# Patient Record
Sex: Female | Born: 1970 | Race: White | Hispanic: No | Marital: Married | Smoking: Never smoker
Health system: Southern US, Community
[De-identification: ages and names within clinical notes are randomized; demographics above are authoritative.]

## PROBLEM LIST (undated history)

## (undated) DIAGNOSIS — E669 Obesity, unspecified: Secondary | ICD-10-CM

## (undated) DIAGNOSIS — J310 Chronic rhinitis: Secondary | ICD-10-CM

## (undated) DIAGNOSIS — B369 Superficial mycosis, unspecified: Secondary | ICD-10-CM

## (undated) DIAGNOSIS — I10 Essential (primary) hypertension: Secondary | ICD-10-CM

## (undated) HISTORY — DX: Essential (primary) hypertension: I10

## (undated) HISTORY — PX: TONSILLECTOMY: SUR1361

## (undated) HISTORY — DX: Superficial mycosis, unspecified: B36.9

## (undated) HISTORY — DX: Obesity, unspecified: E66.9

## (undated) HISTORY — DX: Chronic rhinitis: J31.0

---
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
## 2005-01-05 ENCOUNTER — Encounter: Payer: Self-pay | Admitting: Family Medicine

## 2005-01-05 LAB — CONVERTED CEMR LAB

## 2006-01-09 ENCOUNTER — Ambulatory Visit: Payer: Self-pay | Admitting: Family Medicine

## 2006-01-29 ENCOUNTER — Encounter: Payer: Self-pay | Admitting: Family Medicine

## 2006-02-12 ENCOUNTER — Encounter: Payer: Self-pay | Admitting: Family Medicine

## 2006-02-12 LAB — CONVERTED CEMR LAB
BUN: 15 mg/dL
CO2: 26 meq/L
Chloride: 104 meq/L
Cholesterol: 200 mg/dL
Creatinine, Ser: 0.8 mg/dL
HDL: 55 mg/dL
Platelets: 307 10*3/uL
Potassium: 3.8 meq/L
WBC, blood: 6.2 10*3/uL

## 2006-03-01 ENCOUNTER — Ambulatory Visit: Payer: Self-pay | Admitting: Family Medicine

## 2006-04-12 ENCOUNTER — Ambulatory Visit: Payer: Self-pay | Admitting: Family Medicine

## 2006-05-15 DIAGNOSIS — E669 Obesity, unspecified: Secondary | ICD-10-CM | POA: Insufficient documentation

## 2006-07-05 ENCOUNTER — Encounter: Payer: Self-pay | Admitting: Family Medicine

## 2006-07-05 ENCOUNTER — Ambulatory Visit: Payer: Self-pay | Admitting: Family Medicine

## 2006-07-05 DIAGNOSIS — I10 Essential (primary) hypertension: Secondary | ICD-10-CM | POA: Insufficient documentation

## 2006-07-05 DIAGNOSIS — N920 Excessive and frequent menstruation with regular cycle: Secondary | ICD-10-CM

## 2006-07-20 ENCOUNTER — Ambulatory Visit: Payer: Self-pay | Admitting: Family Medicine

## 2006-08-14 ENCOUNTER — Encounter: Payer: Self-pay | Admitting: Family Medicine

## 2006-09-03 ENCOUNTER — Ambulatory Visit: Payer: Self-pay | Admitting: Family Medicine

## 2006-12-03 ENCOUNTER — Ambulatory Visit: Payer: Self-pay | Admitting: Family Medicine

## 2006-12-03 DIAGNOSIS — J310 Chronic rhinitis: Secondary | ICD-10-CM | POA: Insufficient documentation

## 2007-02-06 ENCOUNTER — Encounter: Payer: Self-pay | Admitting: Family Medicine

## 2007-02-06 LAB — CONVERTED CEMR LAB
ALT: 18 units/L (ref 0–35)
AST: 16 units/L (ref 0–37)
Albumin: 3.7 g/dL (ref 3.5–5.2)
BUN: 18 mg/dL (ref 6–23)
Calcium: 9.4 mg/dL (ref 8.4–10.5)
Chloride: 106 meq/L (ref 96–112)
Creatinine, Ser: 0.71 mg/dL (ref 0.40–1.20)
HDL: 72 mg/dL (ref >39)
Potassium: 4.9 meq/L (ref 3.5–5.3)
Total Bilirubin: 0.3 mg/dL (ref 0.3–1.2)
Total CHOL/HDL Ratio: 2.9
Total Protein: 6.8 g/dL (ref 6.0–8.3)
Triglycerides: 97 mg/dL (ref <150)
VLDL: 19 mg/dL (ref 0–40)

## 2007-02-11 ENCOUNTER — Encounter: Payer: Self-pay | Admitting: Family Medicine

## 2007-03-08 ENCOUNTER — Other Ambulatory Visit: Admission: RE | Admit: 2007-03-08 | Discharge: 2007-03-08 | Payer: Self-pay | Admitting: Family Medicine

## 2007-03-08 ENCOUNTER — Encounter: Payer: Self-pay | Admitting: Family Medicine

## 2007-03-08 ENCOUNTER — Ambulatory Visit: Payer: Self-pay | Admitting: Family Medicine

## 2007-03-13 ENCOUNTER — Ambulatory Visit: Payer: Self-pay | Admitting: Family Medicine

## 2007-03-13 DIAGNOSIS — B369 Superficial mycosis, unspecified: Secondary | ICD-10-CM | POA: Insufficient documentation

## 2007-03-14 ENCOUNTER — Telehealth (INDEPENDENT_AMBULATORY_CARE_PROVIDER_SITE_OTHER): Payer: Self-pay | Admitting: *Deleted

## 2008-03-23 ENCOUNTER — Encounter: Payer: Self-pay | Admitting: Family Medicine

## 2008-03-23 ENCOUNTER — Other Ambulatory Visit: Admission: RE | Admit: 2008-03-23 | Discharge: 2008-03-23 | Payer: Self-pay | Admitting: Family Medicine

## 2008-03-23 ENCOUNTER — Ambulatory Visit: Payer: Self-pay | Admitting: Family Medicine

## 2008-03-23 ENCOUNTER — Encounter: Admission: RE | Admit: 2008-03-23 | Discharge: 2008-03-23 | Payer: Self-pay | Admitting: Family Medicine

## 2008-03-25 ENCOUNTER — Encounter: Payer: Self-pay | Admitting: Family Medicine

## 2008-03-25 LAB — CONVERTED CEMR LAB
ALT: 30 units/L (ref 0–35)
CO2: 24 meq/L (ref 19–32)
Creatinine, Ser: 0.74 mg/dL (ref 0.40–1.20)
Glucose, Bld: 90 mg/dL (ref 70–99)
HDL: 69 mg/dL (ref >39)
LDL Goal: 160 mg/dL
Potassium: 4.2 meq/L (ref 3.5–5.3)
Sodium: 137 meq/L (ref 135–145)
Total Bilirubin: 0.5 mg/dL (ref 0.3–1.2)
Total Protein: 7.1 g/dL (ref 6.0–8.3)

## 2008-03-26 LAB — CONVERTED CEMR LAB: Pap Smear: NORMAL

## 2010-01-31 ENCOUNTER — Ambulatory Visit (HOSPITAL_COMMUNITY): Admission: RE | Admit: 2010-01-31 | Discharge: 2010-01-31 | Payer: Self-pay | Admitting: Family Medicine

## 2012-01-15 ENCOUNTER — Other Ambulatory Visit: Payer: Self-pay | Admitting: Gynecology

## 2013-01-01 ENCOUNTER — Other Ambulatory Visit: Payer: Self-pay | Admitting: Women's Health

## 2013-01-01 DIAGNOSIS — Z139 Encounter for screening, unspecified: Secondary | ICD-10-CM

## 2013-01-13 ENCOUNTER — Ambulatory Visit (HOSPITAL_COMMUNITY)
Admission: RE | Admit: 2013-01-13 | Discharge: 2013-01-13 | Disposition: A | Discharge status: Home / Self Care | Payer: BC Managed Care – PPO | Source: Ambulatory Visit | Attending: Women's Health | Admitting: Women's Health

## 2013-01-13 DIAGNOSIS — Z1231 Encounter for screening mammogram for malignant neoplasm of breast: Secondary | ICD-10-CM | POA: Insufficient documentation

## 2013-01-13 DIAGNOSIS — Z139 Encounter for screening, unspecified: Secondary | ICD-10-CM

## 2013-01-23 ENCOUNTER — Ambulatory Visit (INDEPENDENT_AMBULATORY_CARE_PROVIDER_SITE_OTHER): Payer: BC Managed Care – PPO | Admitting: Women's Health

## 2013-01-23 ENCOUNTER — Encounter: Payer: Self-pay | Admitting: Women's Health

## 2013-01-23 VITALS — BP 112/78 | Ht 64.0 in | Wt 208.5 lb

## 2013-01-23 DIAGNOSIS — Z13220 Encounter for screening for lipoid disorders: Secondary | ICD-10-CM

## 2013-01-23 DIAGNOSIS — E079 Disorder of thyroid, unspecified: Secondary | ICD-10-CM

## 2013-01-23 DIAGNOSIS — Z833 Family history of diabetes mellitus: Secondary | ICD-10-CM

## 2013-01-23 DIAGNOSIS — IMO0001 Reserved for inherently not codable concepts without codable children: Secondary | ICD-10-CM

## 2013-01-23 DIAGNOSIS — Z309 Encounter for contraceptive management, unspecified: Secondary | ICD-10-CM

## 2013-01-23 DIAGNOSIS — Z01419 Encounter for gynecological examination (general) (routine) without abnormal findings: Secondary | ICD-10-CM

## 2013-01-23 LAB — LIPID PANEL
HDL: 70 mg/dL (ref >39)
LDL Cholesterol: 101 mg/dL — ABNORMAL HIGH (ref 0–99)
Triglycerides: 152 mg/dL — ABNORMAL HIGH (ref <150)

## 2013-01-23 LAB — CBC WITH DIFFERENTIAL/PLATELET
Eosinophils Relative: 2 % (ref 0–5)
Hemoglobin: 14 g/dL (ref 12.0–15.0)
MCH: 25.5 pg — ABNORMAL LOW (ref 26.0–34.0)
MCHC: 32.6 g/dL (ref 30.0–36.0)
MCV: 78.3 fL (ref 78.0–100.0)
Neutro Abs: 4.1 10*3/uL (ref 1.7–7.7)
Platelets: 342 10*3/uL (ref 150–400)
RBC: 5.48 MIL/uL — ABNORMAL HIGH (ref 3.87–5.11)
WBC: 7.5 10*3/uL (ref 4.0–10.5)

## 2013-01-23 LAB — TSH: TSH: 2.922 u[IU]/mL (ref 0.350–4.500)

## 2013-01-23 LAB — GLUCOSE, RANDOM: Glucose, Bld: 83 mg/dL (ref 70–99)

## 2013-01-23 MED ORDER — ETONOGESTREL-ETHINYL ESTRADIOL 0.12-0.015 MG/24HR VA RING: VAGINAL_RING | VAGINAL | Status: DC

## 2013-01-23 NOTE — Patient Instructions (Signed)

## 2013-01-23 NOTE — Progress Notes (Signed)
Sara Harding January 12, 1971 [id number]    History:    New patient from Dr Nicholas Lose presents for annual exam. Monthly cycle on NuvaRing with good relief from menorrhagia. Not sexually active years. Normal Pap and mammogram history.   Past medical history, past surgical history, family history and social history were all reviewed and documented in the EPIC chart. 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 Father 15 and healthy.   ROS:  A  ROS was performed and pertinent positives and negatives are included in the history.  Exam:  Filed Vitals:   01/23/13 0932  BP: 112/78    General appearance:  Normal Head/Neck:  Normal, without cervical or supraclavicular adenopathy. Thyroid:  Symmetrical, normal in size, without palpable masses or nodularity. Respiratory  Effort:  Normal  Auscultation:  Clear without wheezing or rhonchi Cardiovascular  Auscultation:  Regular rate, without rubs, murmurs or gallops  Edema/varicosities:  Not grossly evident Abdominal  Soft,nontender, without masses, guarding or rebound.  Liver/spleen:  No organomegaly noted  Hernia:  None appreciated  Skin  Inspection:  Grossly normal  Palpation:  Grossly normal Neurologic/psychiatric  Orientation:  Normal with appropriate conversation.  Mood/affect:  Normal  Genitourinary    Breasts: Examined lying and sitting.     Right: Without masses, retractions, discharge or axillary adenopathy.     Left: Without masses, retractions, discharge or axillary adenopathy.   Inguinal/mons:  Normal without inguinal adenopathy  External genitalia:  Normal  BUS/Urethra/Skene's glands:  Normal  Bladder:  Normal  Vagina:  Normal  Cervix:  Normal  Uterus:   normal in size, shape and contour.  Midline and mobile  Adnexa/parametria:     Rt: Without masses or tenderness.   Lt: Without masses or tenderness.  Anus and perineum: Normal  Digital rectal exam: Normal sphincter tone without palpated masses or  tenderness  Assessment/Plan:  42 y.o. SWF G0  for annual exam with no complaints.  NuvaRing for cycle control/menorrhagia Obesity  Plan: Continue zumba classes, decrease calories for weight loss and health. SBE's, continue annual mammogram, calcium rich diet, vitamin D 1000 daily encouraged. CBC, glucose, lipid panel, TSH, UA, Pap normal 2013, new screening guidelines reviewed. Nuva ring prescription, proper use, risk for blood clots and strokes reviewed and accepted.    [name] [facility], 10:16 AM 01/23/2013

## 2013-01-24 LAB — URINALYSIS W MICROSCOPIC + REFLEX CULTURE
Bacteria, UA: NONE SEEN
Bilirubin Urine: NEGATIVE
Casts: NONE SEEN
Crystals: NONE SEEN
Glucose, UA: NEGATIVE mg/dL
Ketones, ur: NEGATIVE mg/dL
Leukocytes, UA: NEGATIVE
Nitrite: NEGATIVE
Protein, ur: NEGATIVE mg/dL
Specific Gravity, Urine: 1.019 (ref 1.005–1.030)
Squamous Epithelial / HPF: NONE SEEN
Urobilinogen, UA: 0.2 mg/dL (ref 0.0–1.0)
pH: 6.5 (ref 5.0–8.0)

## 2013-06-12 ENCOUNTER — Other Ambulatory Visit: Payer: Self-pay

## 2013-07-22 ENCOUNTER — Emergency Department (HOSPITAL_COMMUNITY)
Admission: EM | Admit: 2013-07-22 | Discharge: 2013-07-22 | Disposition: A | Discharge status: Home / Self Care | Payer: BC Managed Care – PPO | Attending: Emergency Medicine | Admitting: Emergency Medicine

## 2013-07-22 ENCOUNTER — Emergency Department (HOSPITAL_COMMUNITY): Payer: BC Managed Care – PPO

## 2013-07-22 ENCOUNTER — Encounter (HOSPITAL_COMMUNITY): Payer: Self-pay | Admitting: Emergency Medicine

## 2013-07-22 DIAGNOSIS — IMO0001 Reserved for inherently not codable concepts without codable children: Secondary | ICD-10-CM | POA: Insufficient documentation

## 2013-07-22 DIAGNOSIS — J159 Unspecified bacterial pneumonia: Secondary | ICD-10-CM | POA: Insufficient documentation

## 2013-07-22 DIAGNOSIS — J189 Pneumonia, unspecified organism: Secondary | ICD-10-CM

## 2013-07-22 DIAGNOSIS — R000 Tachycardia, unspecified: Secondary | ICD-10-CM | POA: Insufficient documentation

## 2013-07-22 DIAGNOSIS — Z79899 Other long term (current) drug therapy: Secondary | ICD-10-CM | POA: Insufficient documentation

## 2013-07-22 MED ORDER — ALBUTEROL SULFATE HFA 108 (90 BASE) MCG/ACT IN AERS
2.0000 | INHALATION_SPRAY | RESPIRATORY_TRACT | Status: DC
  Administered 2013-07-22: 2 via RESPIRATORY_TRACT
  Filled 2013-07-22: qty 6.7

## 2013-07-22 MED ORDER — HYDROCODONE-ACETAMINOPHEN 5-325 MG PO TABS: ORAL_TABLET | ORAL | Status: DC

## 2013-07-22 MED ORDER — CEFTRIAXONE SODIUM 1 G IJ SOLR
1.0000 g | Freq: Once | INTRAMUSCULAR | Status: AC
  Administered 2013-07-22: 1 g via INTRAMUSCULAR
  Filled 2013-07-22: qty 10

## 2013-07-22 MED ORDER — ONDANSETRON 4 MG PO TBDP
4.0000 mg | ORAL_TABLET | Freq: Once | ORAL | Status: AC
  Administered 2013-07-22: 4 mg via ORAL
  Filled 2013-07-22: qty 1

## 2013-07-22 MED ORDER — ACETAMINOPHEN 325 MG PO TABS
650.0000 mg | ORAL_TABLET | Freq: Once | ORAL | Status: AC
  Administered 2013-07-22: 650 mg via ORAL
  Filled 2013-07-22: qty 2

## 2013-07-22 MED ORDER — AZITHROMYCIN 250 MG PO TABS
500.0000 mg | ORAL_TABLET | Freq: Once | ORAL | Status: AC
  Administered 2013-07-22: 500 mg via ORAL
  Filled 2013-07-22: qty 2

## 2013-07-22 MED ORDER — HYDROCODONE-ACETAMINOPHEN 5-325 MG PO TABS
2.0000 | ORAL_TABLET | Freq: Once | ORAL | Status: AC
  Administered 2013-07-22: 2 via ORAL
  Filled 2013-07-22: qty 2

## 2013-07-22 MED ORDER — LIDOCAINE HCL (PF) 1 % IJ SOLN
INTRAMUSCULAR | Status: AC
  Administered 2013-07-22: 2.1 mL
  Filled 2013-07-22: qty 5

## 2013-07-22 MED ORDER — AZITHROMYCIN 250 MG PO TABS: ORAL_TABLET | ORAL | Status: DC

## 2013-07-22 NOTE — ED Notes (Signed)
Pt c/o right rib pain and sinus congestion.

## 2013-07-22 NOTE — ED Notes (Signed)
Pain rt side of chest, says she has been breathing shallow due to  Pain.  Sinus congestion.  Alert, NAD

## 2013-07-22 NOTE — ED Provider Notes (Signed)
CSN: [id number]     Arrival date & time 07/22/13  1804 History   First MD Initiated Contact with Patient 07/22/13 1929     Chief Complaint  Patient presents with  . Rib pain    (Consider location/radiation/quality/duration/timing/severity/associated sxs/prior Treatment) Patient is a 42 y.o. female presenting with chest pain. The history is provided by the patient.  Chest Pain Pain location:  R chest Pain quality: aching   Pain radiates to:  Does not radiate Pain radiates to the back: no   Pain severity:  Moderate Onset quality:  Gradual Timing:  Intermittent Progression:  Worsening Context: breathing and movement   Relieved by:  Nothing Exacerbated by: cough and certain movement. Associated symptoms: shortness of breath   Associated symptoms: no abdominal pain, no back pain, no cough, no dizziness and no palpitations   Risk factors: no hypertension, no immobilization, no smoking and no surgery     History reviewed. No pertinent past medical history. Past Surgical History  Procedure Laterality Date  . Tonsillectomy     Family History  Problem Relation Age of Onset  . Diabetes Mother   . Hypertension Mother    History  Substance Use Topics  . Smoking status: Never Smoker   . Smokeless tobacco: Never Used  . Alcohol Use: Yes     Comment: socially   OB History   Grav Para Term Preterm Abortions TAB SAB Ect Mult Living   0              Review of Systems  Constitutional: Negative for activity change.       All ROS Neg except as noted in HPI  HENT: Negative for nosebleeds.   Eyes: Negative for photophobia and discharge.  Respiratory: Positive for shortness of breath. Negative for cough and wheezing.   Cardiovascular: Positive for chest pain. Negative for palpitations.  Gastrointestinal: Negative for abdominal pain and blood in stool.  Genitourinary: Negative for dysuria, frequency and hematuria.  Musculoskeletal: Positive for myalgias. Negative for arthralgias, back  pain and neck pain.  Skin: Negative.   Neurological: Negative for dizziness, seizures and speech difficulty.  Psychiatric/Behavioral: Negative for hallucinations and confusion.    Allergies  Diphenhydramine hcl  Home Medications   Current Outpatient Rx  Name  Route  Sig  Dispense  Refill  . Cholecalciferol (VITAMIN D) 400 UNITS capsule   Oral   Take 400 Units by mouth daily.         Marland Kitchen etonogestrel-ethinyl estradiol (NUVARING) 0.12-0.015 MG/24HR vaginal ring      Insert vaginally and leave in place for 3 consecutive weeks, then remove for 1 week.   1 each   12   . Multiple Vitamin (MULTIVITAMIN) tablet   Oral   Take 1 tablet by mouth daily.          BP 150/76  Pulse 120  Temp(Src) 102.1 F (38.9 C) (Oral)  Resp 18  SpO2 100%  LMP 07/08/2013 Physical Exam  Nursing note and vitals reviewed. Constitutional: She is oriented to person, place, and time. She appears well-developed and well-nourished.  Non-toxic appearance.  HENT:  Head: Normocephalic.  Right Ear: Tympanic membrane and external ear normal.  Left Ear: Tympanic membrane and external ear normal.  Eyes: EOM and lids are normal. Pupils are equal, round, and reactive to light.  Neck: Normal range of motion. Neck supple. Carotid bruit is not present.  Cardiovascular: Regular rhythm, normal heart sounds, intact distal pulses and normal pulses.  Tachycardia present.  Pulmonary/Chest: Breath sounds normal. No respiratory distress. She exhibits tenderness.  Right greater than left chest wall tenderness and rhonchi. No rub. Course breath sounds on the right.  Abdominal: Soft. Bowel sounds are normal. There is no tenderness. There is no guarding.  Musculoskeletal: Normal range of motion.  Lymphadenopathy:       Head (right side): No submandibular adenopathy present.       Head (left side): No submandibular adenopathy present.    She has no cervical adenopathy.  Neurological: She is alert and oriented to person,  place, and time. She has normal strength. No cranial nerve deficit or sensory deficit.  Skin: Skin is warm and dry.  Psychiatric: She has a normal mood and affect. Her speech is normal.    ED Course  Procedures (including critical care time) Labs Review Labs Reviewed - No data to display Imaging Review Dg Chest 2 View  07/22/2013   CLINICAL DATA:  Cough and right-sided chest pain  EXAM: CHEST  2 VIEW  COMPARISON:  None.  FINDINGS: The heart and pulmonary vascularity are within normal limits. The left lung is clear. The right lung demonstrates diffuse upper lobe infiltrate consistent with pneumonia. No sizable effusion is seen. No bony abnormality is noted.  IMPRESSION: Right upper lobe pneumonia. Followup films following appropriate therapy are recommended.   Electronically Signed   By: Alcide Clever M.D.   On: 07/22/2013 18:41    EKG Interpretation   None       MDM  No diagnosis found. **I have reviewed nursing notes, vital signs, and all appropriate lab and imaging results for this patient.*  Pulse Ox 100% on room air. WNL by my interpretation. Chest x-ray is consistent with right upper lobe pneumonia. Plan: Patient treated in the emergency department with intramuscular Rocephin and oral Zithromax. Patient given a albuterol inhaler. Prescription for Zithromax and Norco given to the patient. Patient advised to be rechecked in one week. Patient invited to return to the emergency department if any changes, problems, or concerns.  Kathie Dike, PA-C 07/22/13 1952  Kathie Dike, PA-C 07/22/13 1954

## 2013-07-23 NOTE — ED Provider Notes (Signed)
  Medical screening examination/treatment/procedure(s) were performed by non-physician practitioner and as supervising physician I was immediately available for consultation/collaboration.  EKG Interpretation   None          Jazmine Heckman, MD 07/23/13 0015 

## 2013-08-04 ENCOUNTER — Ambulatory Visit (HOSPITAL_COMMUNITY)
Admission: RE | Admit: 2013-08-04 | Discharge: 2013-08-04 | Disposition: A | Discharge status: Home / Self Care | Payer: BC Managed Care – PPO | Source: Ambulatory Visit | Attending: Family Medicine | Admitting: Family Medicine

## 2013-08-04 ENCOUNTER — Other Ambulatory Visit (HOSPITAL_COMMUNITY): Payer: Self-pay | Admitting: Family Medicine

## 2013-08-04 DIAGNOSIS — Z09 Encounter for follow-up examination after completed treatment for conditions other than malignant neoplasm: Secondary | ICD-10-CM

## 2013-08-04 DIAGNOSIS — J189 Pneumonia, unspecified organism: Secondary | ICD-10-CM

## 2013-08-04 DIAGNOSIS — R918 Other nonspecific abnormal finding of lung field: Secondary | ICD-10-CM | POA: Insufficient documentation

## 2014-01-23 ENCOUNTER — Other Ambulatory Visit: Payer: Self-pay | Admitting: Women's Health

## 2014-01-23 DIAGNOSIS — Z1231 Encounter for screening mammogram for malignant neoplasm of breast: Secondary | ICD-10-CM

## 2014-01-27 ENCOUNTER — Ambulatory Visit (HOSPITAL_COMMUNITY): Payer: BC Managed Care – PPO

## 2014-02-09 ENCOUNTER — Ambulatory Visit (HOSPITAL_COMMUNITY)
Admission: RE | Admit: 2014-02-09 | Discharge: 2014-02-09 | Disposition: A | Discharge status: Home / Self Care | Payer: BC Managed Care – PPO | Source: Ambulatory Visit | Attending: Women's Health | Admitting: Women's Health

## 2014-02-09 DIAGNOSIS — Z1231 Encounter for screening mammogram for malignant neoplasm of breast: Secondary | ICD-10-CM | POA: Insufficient documentation

## 2014-02-11 ENCOUNTER — Other Ambulatory Visit: Payer: Self-pay | Admitting: Women's Health

## 2014-02-11 MED ORDER — FLUCONAZOLE 150 MG PO TABS: 150.0000 mg | ORAL_TABLET | Freq: Once | ORAL | Status: DC

## 2014-02-13 ENCOUNTER — Ambulatory Visit (INDEPENDENT_AMBULATORY_CARE_PROVIDER_SITE_OTHER): Payer: BC Managed Care – PPO | Admitting: Women's Health

## 2014-02-13 ENCOUNTER — Encounter: Payer: Self-pay | Admitting: Women's Health

## 2014-02-13 VITALS — BP 115/75 | Ht 65.0 in | Wt 216.4 lb

## 2014-02-13 DIAGNOSIS — Z01419 Encounter for gynecological examination (general) (routine) without abnormal findings: Secondary | ICD-10-CM

## 2014-02-13 DIAGNOSIS — Z833 Family history of diabetes mellitus: Secondary | ICD-10-CM

## 2014-02-13 NOTE — Progress Notes (Signed)
Sara Harding 1970/09/11 [id number]    History:    Presents for annual exam.  Regular monthly 2 days cycle/not sexually active. Had been on NuvaRing for menorrhagia no longer a problem and has stopped. Reports questionable allergic reaction to NuvaRing with a rash. Normal Pap and mammogram history.  Past medical history, past surgical history, family history and social history were all reviewed and documented in the EPIC chart. 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 Roommate is a man/not sexually active.  ROS:  A  12 point ROS was performed and pertinent positives and negatives are included.  Exam:  Filed Vitals:   02/13/14 1006  BP: 115/75    General appearance:  Normal Thyroid:  Symmetrical, normal in size, without palpable masses or nodularity. Respiratory  Auscultation:  Clear without wheezing or rhonchi Cardiovascular  Auscultation:  Regular rate, without rubs, murmurs or gallops  Edema/varicosities:  Not grossly evident Abdominal  Soft,nontender, without masses, guarding or rebound.  Liver/spleen:  No organomegaly noted  Hernia:  None appreciated  Skin  Inspection:  Grossly normal   Breasts: Examined lying and sitting.     Right: Without masses, retractions, discharge or axillary adenopathy.     Left: Without masses, retractions, discharge or axillary adenopathy. Gentitourinary   Inguinal/mons:  Normal without inguinal adenopathy  External genitalia:  Normal  BUS/Urethra/Skene's glands:  Normal  Vagina:  Normal  Cervix:  Normal  Uterus:   normal in size, shape and contour.  Midline and mobile  Adnexa/parametria:     Rt: Without masses or tenderness.   Lt: Without masses or tenderness.  Anus and perineum: Normal  Digital rectal exam: Normal sphincter tone without palpated masses or tenderness  Assessment/Plan:  43 y.o.SWF G0  for annual exam.     Normal GYN exam Obesity  Plan:. SBE's, continue annual  mammogram, calcium rich diet, vitamin D 1000 daily encouraged. Increase exercise and decrease calories for weight loss encouraged. Condoms encouraged if sexually active. CBC, glucose, lipid panel, UA,. Pap normal 2013, new screening guidelines reviewed.   Note: This dictation was prepared with Dragon/digital dictation.  Any transcriptional errors that result are unintentional. Sara Harding [facility], 10:36 AM 02/13/2014

## 2014-02-13 NOTE — Patient Instructions (Signed)

## 2014-02-14 LAB — URINALYSIS W MICROSCOPIC + REFLEX CULTURE
BILIRUBIN URINE: NEGATIVE
Bacteria, UA: NONE SEEN
Casts: NONE SEEN
Crystals: NONE SEEN
GLUCOSE, UA: NEGATIVE mg/dL
Hgb urine dipstick: NEGATIVE
Ketones, ur: NEGATIVE mg/dL
Leukocytes, UA: NEGATIVE
NITRITE: NEGATIVE
PH: 7.5 (ref 5.0–8.0)
Protein, ur: NEGATIVE mg/dL
SPECIFIC GRAVITY, URINE: 1.022 (ref 1.005–1.030)
UROBILINOGEN UA: 0.2 mg/dL (ref 0.0–1.0)

## 2014-02-14 LAB — LIPID PANEL
CHOL/HDL RATIO: 3.2 ratio
CHOLESTEROL: 186 mg/dL (ref 0–200)
HDL: 59 mg/dL (ref >39)
LDL Cholesterol: 101 mg/dL — ABNORMAL HIGH (ref 0–99)
TRIGLYCERIDES: 130 mg/dL (ref <150)
VLDL: 26 mg/dL (ref 0–40)

## 2014-02-14 LAB — CBC WITH DIFFERENTIAL/PLATELET
BASOS ABS: 0.1 10*3/uL (ref 0.0–0.1)
BASOS PCT: 1 % (ref 0–1)
EOS ABS: 0.3 10*3/uL (ref 0.0–0.7)
Eosinophils Relative: 4 % (ref 0–5)
HEMATOCRIT: 37.7 % (ref 36.0–46.0)
HEMOGLOBIN: 12 g/dL (ref 12.0–15.0)
Lymphocytes Relative: 40 % (ref 12–46)
Lymphs Abs: 2.6 10*3/uL (ref 0.7–4.0)
MCH: 23.9 pg — AB (ref 26.0–34.0)
MCHC: 31.8 g/dL (ref 30.0–36.0)
MCV: 75 fL — AB (ref 78.0–100.0)
MONOS PCT: 10 % (ref 3–12)
Monocytes Absolute: 0.6 10*3/uL (ref 0.1–1.0)
NEUTROS PCT: 45 % (ref 43–77)
Neutro Abs: 2.9 10*3/uL (ref 1.7–7.7)
Platelets: 314 10*3/uL (ref 150–400)
RBC: 5.03 MIL/uL (ref 3.87–5.11)
RDW: 15.1 % (ref 11.5–15.5)
WBC: 6.4 10*3/uL (ref 4.0–10.5)

## 2014-02-14 LAB — GLUCOSE, RANDOM: Glucose, Bld: 74 mg/dL (ref 70–99)

## 2014-02-16 ENCOUNTER — Encounter: Payer: Self-pay | Admitting: Women's Health

## 2014-08-31 IMAGING — CR DG CHEST 2V
2 series · 2 of 2 positions shown · non-contrast
Comparison: 07/22/2013

CLINICAL DATA: Followup right upper lobe

EXAM:
CHEST  2 VIEW

[view not recorded (1 of 2)]
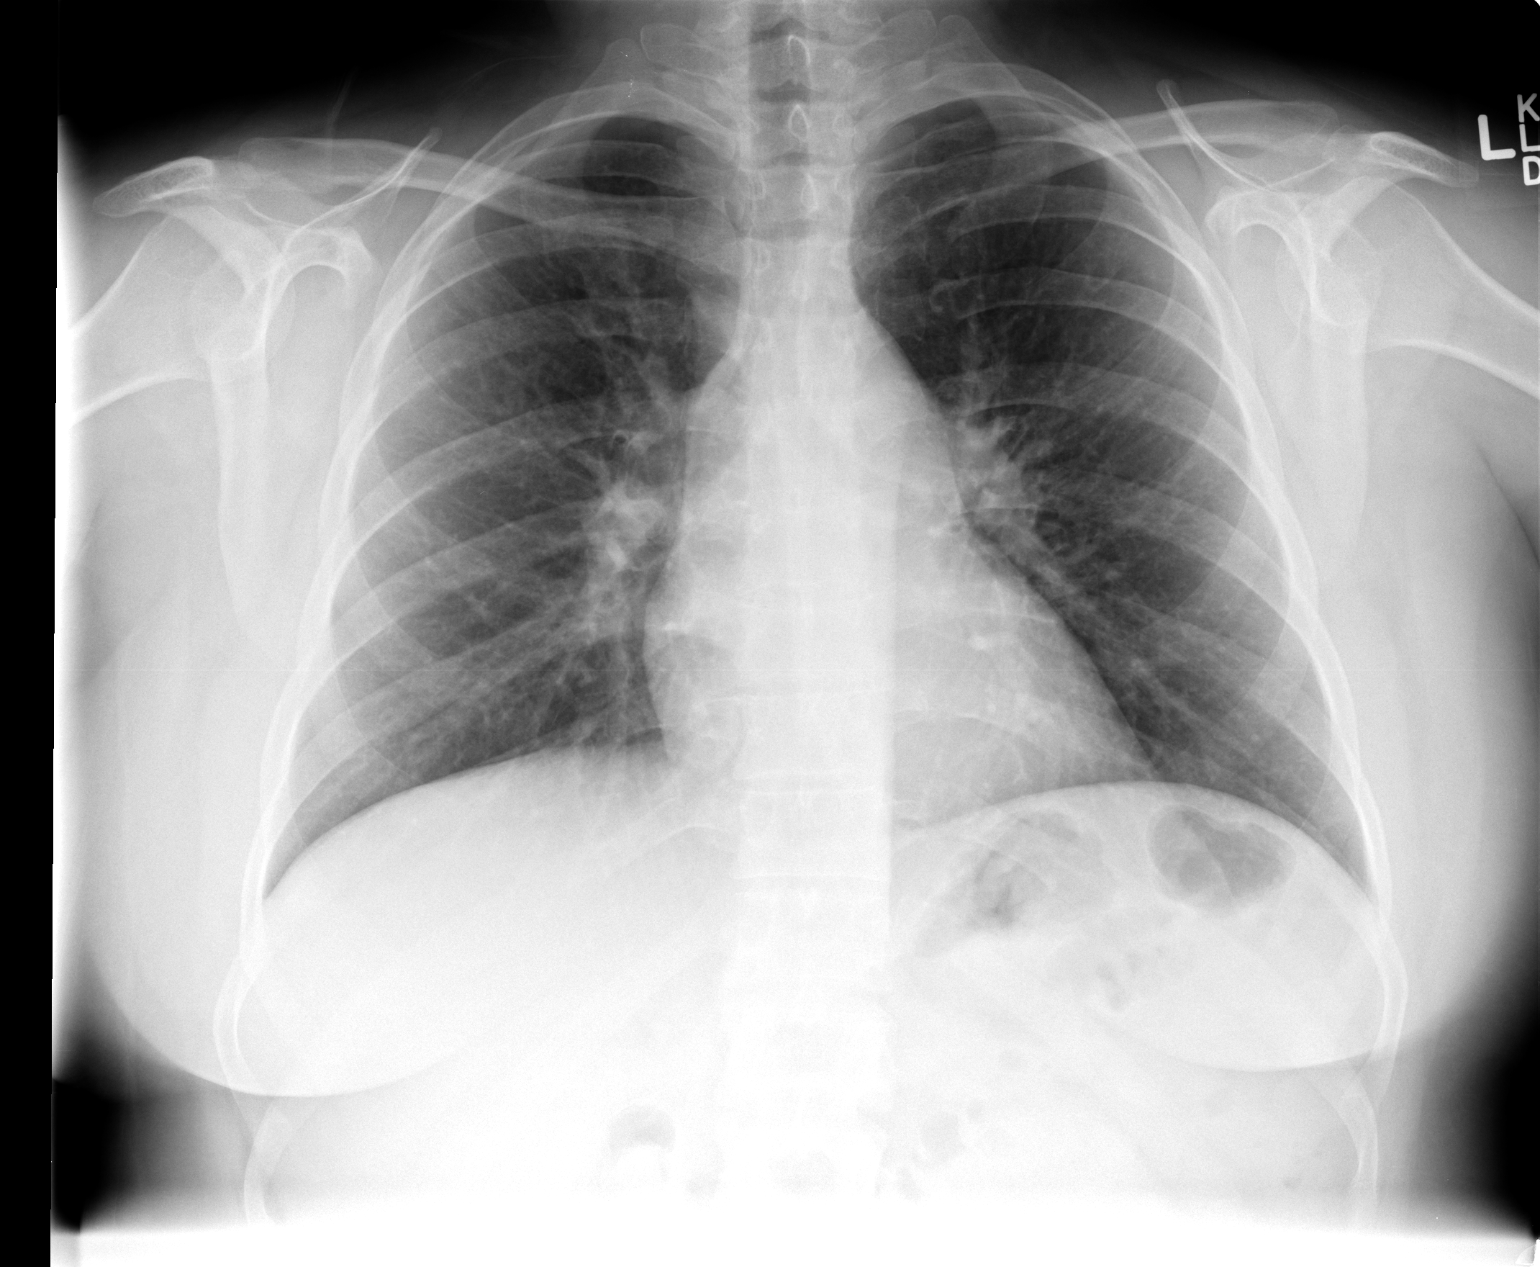

[view not recorded (2 of 2)]
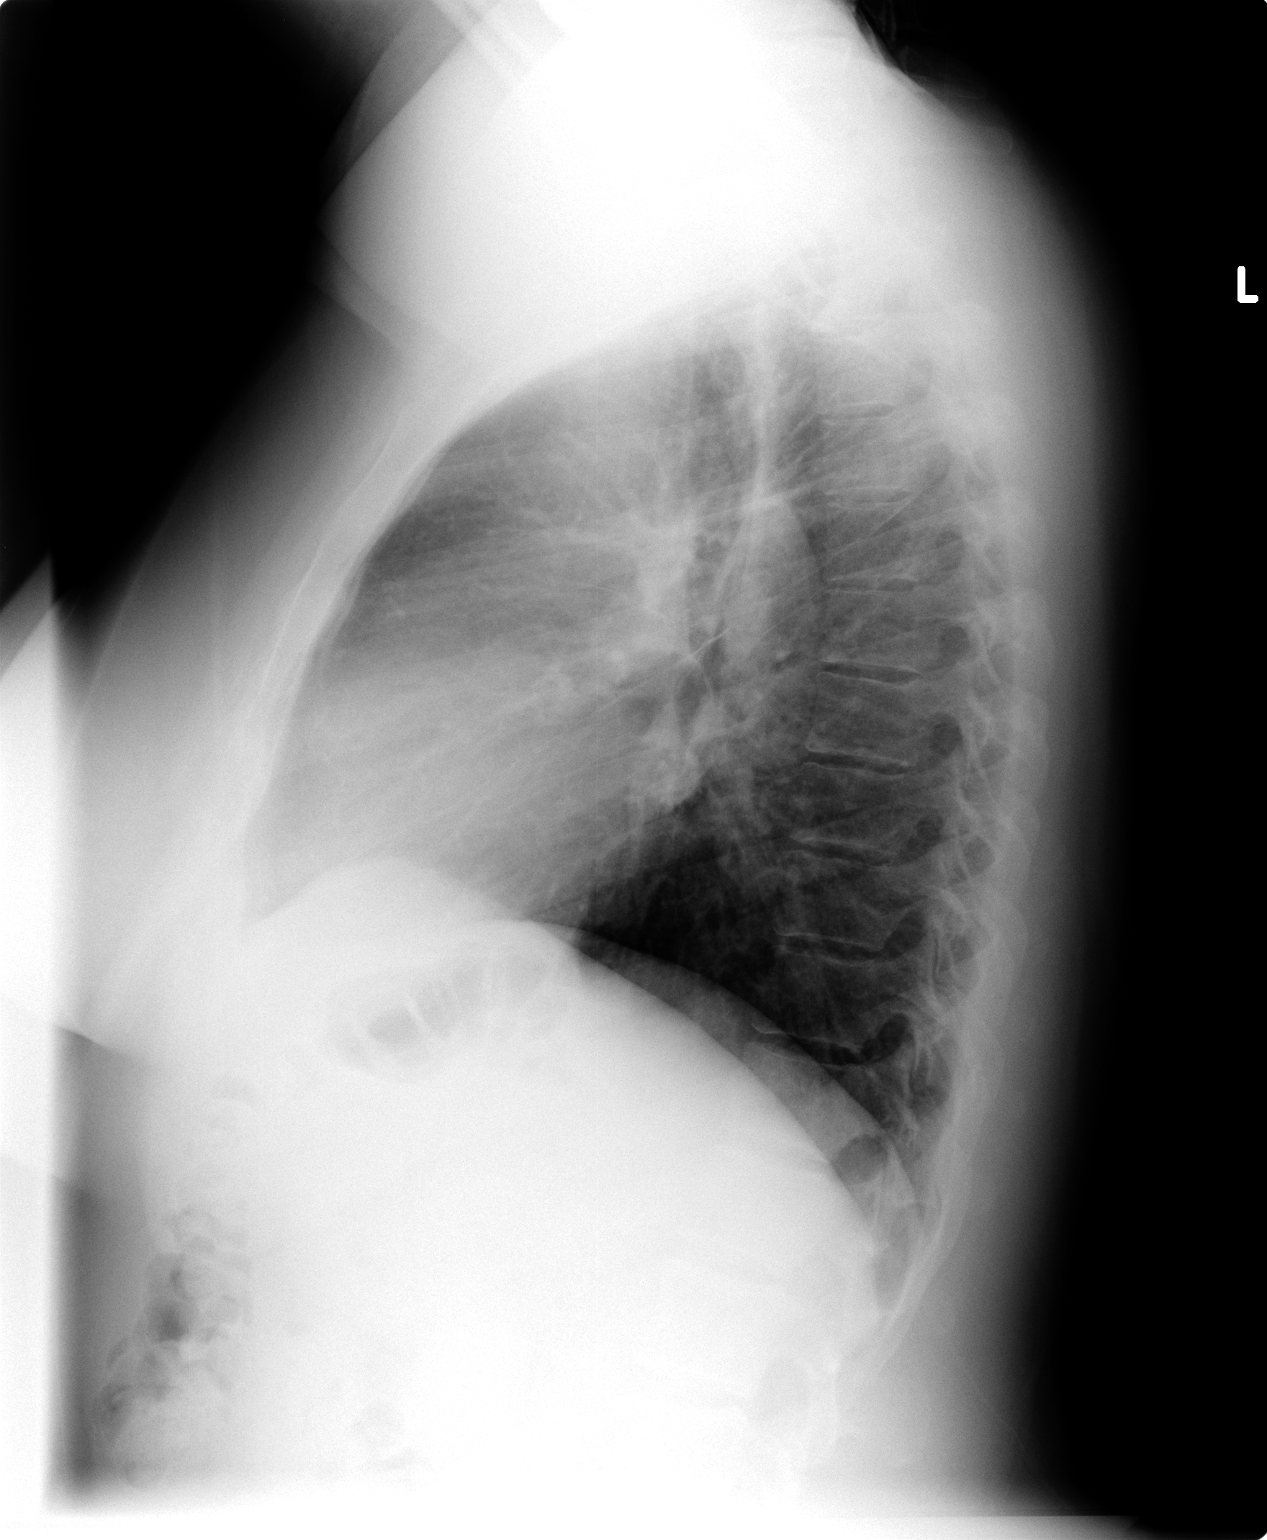

[2 of 2 positions shown; findings below may reference images not displayed]

FINDINGS: The heart and pulmonary vascularity are within normal limits. There
is been near complete resolution of the right upper lobe infiltrate.
Only minimal strandy density is noted. No new focal abnormality is
seen.
IMPRESSION: Near-complete resolution of right upper lobe infiltrate.

## 2015-02-02 ENCOUNTER — Other Ambulatory Visit: Payer: Self-pay | Admitting: Women's Health

## 2015-02-02 DIAGNOSIS — Z1231 Encounter for screening mammogram for malignant neoplasm of breast: Secondary | ICD-10-CM

## 2015-02-12 ENCOUNTER — Ambulatory Visit (HOSPITAL_COMMUNITY)
Admission: RE | Admit: 2015-02-12 | Discharge: 2015-02-12 | Disposition: A | Discharge status: Home / Self Care | Payer: BC Managed Care – PPO | Source: Ambulatory Visit | Attending: Women's Health | Admitting: Women's Health

## 2015-02-12 DIAGNOSIS — Z1231 Encounter for screening mammogram for malignant neoplasm of breast: Secondary | ICD-10-CM | POA: Diagnosis present

## 2015-02-22 ENCOUNTER — Other Ambulatory Visit (HOSPITAL_COMMUNITY)
Admission: RE | Admit: 2015-02-22 | Discharge: 2015-02-22 | Disposition: A | Discharge status: Home / Self Care | Payer: BC Managed Care – PPO | Source: Ambulatory Visit | Attending: Women's Health | Admitting: Women's Health

## 2015-02-22 ENCOUNTER — Ambulatory Visit (INDEPENDENT_AMBULATORY_CARE_PROVIDER_SITE_OTHER): Payer: BC Managed Care – PPO | Admitting: Women's Health

## 2015-02-22 ENCOUNTER — Encounter: Payer: Self-pay | Admitting: Women's Health

## 2015-02-22 VITALS — BP 117/76 | Ht 64.0 in | Wt 217.8 lb

## 2015-02-22 DIAGNOSIS — N926 Irregular menstruation, unspecified: Secondary | ICD-10-CM | POA: Diagnosis not present

## 2015-02-22 DIAGNOSIS — Z1151 Encounter for screening for human papillomavirus (HPV): Secondary | ICD-10-CM | POA: Diagnosis present

## 2015-02-22 DIAGNOSIS — Z01419 Encounter for gynecological examination (general) (routine) without abnormal findings: Secondary | ICD-10-CM | POA: Diagnosis not present

## 2015-02-22 DIAGNOSIS — Z13220 Encounter for screening for lipoid disorders: Secondary | ICD-10-CM

## 2015-02-22 LAB — CBC WITH DIFFERENTIAL/PLATELET
Basophils Absolute: 0.1 10*3/uL (ref 0.0–0.1)
Basophils Relative: 1 % (ref 0–1)
EOS ABS: 0.2 10*3/uL (ref 0.0–0.7)
Eosinophils Relative: 3 % (ref 0–5)
HEMATOCRIT: 36 % (ref 36.0–46.0)
Hemoglobin: 11.8 g/dL — ABNORMAL LOW (ref 12.0–15.0)
LYMPHS ABS: 2.3 10*3/uL (ref 0.7–4.0)
Lymphocytes Relative: 30 % (ref 12–46)
MCH: 23.9 pg — AB (ref 26.0–34.0)
MCHC: 32.8 g/dL (ref 30.0–36.0)
MCV: 73 fL — AB (ref 78.0–100.0)
MONOS PCT: 9 % (ref 3–12)
MPV: 8.6 fL (ref 8.6–12.4)
Monocytes Absolute: 0.7 10*3/uL (ref 0.1–1.0)
NEUTROS PCT: 57 % (ref 43–77)
Neutro Abs: 4.3 10*3/uL (ref 1.7–7.7)
PLATELETS: 332 10*3/uL (ref 150–400)
RBC: 4.93 MIL/uL (ref 3.87–5.11)
RDW: 17.7 % — ABNORMAL HIGH (ref 11.5–15.5)
WBC: 7.6 10*3/uL (ref 4.0–10.5)

## 2015-02-22 LAB — COMPREHENSIVE METABOLIC PANEL
ALT: 31 U/L (ref 0–35)
AST: 25 U/L (ref 0–37)
Albumin: 3.9 g/dL (ref 3.5–5.2)
Alkaline Phosphatase: 125 U/L — ABNORMAL HIGH (ref 39–117)
BILIRUBIN TOTAL: 0.4 mg/dL (ref 0.2–1.2)
BUN: 14 mg/dL (ref 6–23)
CALCIUM: 9.7 mg/dL (ref 8.4–10.5)
CO2: 26 mEq/L (ref 19–32)
Chloride: 103 mEq/L (ref 96–112)
Creat: 0.68 mg/dL (ref 0.50–1.10)
GLUCOSE: 88 mg/dL (ref 70–99)
POTASSIUM: 4.2 meq/L (ref 3.5–5.3)
Sodium: 141 mEq/L (ref 135–145)
TOTAL PROTEIN: 6.9 g/dL (ref 6.0–8.3)

## 2015-02-22 LAB — LIPID PANEL
CHOL/HDL RATIO: 3 ratio
Cholesterol: 197 mg/dL (ref 0–200)
HDL: 66 mg/dL (ref >=46)
LDL CALC: 110 mg/dL — AB (ref 0–99)
Triglycerides: 106 mg/dL (ref <150)
VLDL: 21 mg/dL (ref 0–40)

## 2015-02-22 LAB — TSH: TSH: 3.307 u[IU]/mL (ref 0.350–4.500)

## 2015-02-22 NOTE — Progress Notes (Signed)
Sara Harding Nov 11, 1970 [id number]    History:    Presents for annual exam.  Irregular cycles this past year had 2 cycles lasting 20 days, no cycle from March through July had 2 days of light dark spotting using panty liner only. Prior to this year mostly monthly cycles. Not sexually active years. Normal Pap and mammogram history.  Past medical history, past surgical history, family history and social history were all reviewed and documented in the EPIC chart. 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 Father mild stroke, doing well.  ROS:  A ROS was performed and pertinent positives and negatives are included.  Exam:  Filed Vitals:   02/22/15 0956  BP: 117/76    General appearance:  Normal Thyroid:  Symmetrical, normal in size, without palpable masses or nodularity. Respiratory  Auscultation:  Clear without wheezing or rhonchi Cardiovascular  Auscultation:  Regular rate, without rubs, murmurs or gallops  Edema/varicosities:  Not grossly evident Abdominal  Soft,nontender, without masses, guarding or rebound.  Liver/spleen:  No organomegaly noted  Hernia:  None appreciated  Skin  Inspection:  Grossly normal   Breasts: Examined lying and sitting.     Right: Without masses, retractions, discharge or axillary adenopathy.     Left: Without masses, retractions, discharge or axillary adenopathy. Gentitourinary   Inguinal/mons:  Normal without inguinal adenopathy  External genitalia:  Normal  BUS/Urethra/Skene's glands:  Normal  Vagina:  Normal, no visible blood  Cervix:  Normal  Uterus:   normal in size, shape and contour.  Midline and mobile  Adnexa/parametria:     Rt: Without masses or tenderness.   Lt: Without masses or tenderness.  Anus and perineum: Normal  Digital rectal exam: Normal sphincter tone without palpated masses or tenderness  Assessment/Plan:  44 y.o. S WF G0 for annual exam.    Perimenopausal irregular cycles Obesity  Plan: CBC,  CMP, lipid panel, TSH, FSH, UA, Pap with HR HPV typing, new Pap screening guidelines reviewed. SBE's, continue annual screening mammogram, increase exercise and decrease calories for weight loss encouraged. Vitamin D 1000 daily encouraged. Keep menstrual calendar, if cycles last greater than 7 days or space greater than 3 months instructed to call.    Sara Harding,Sara Harding [facility]NP, 10:38 AM 02/22/2015

## 2015-02-22 NOTE — Patient Instructions (Signed)
Health Maintenance Adopting a healthy lifestyle and getting preventive care can go a long way to promote health and wellness. Talk with your health care provider about what schedule of regular examinations is right for you. This is a good chance for you to check in with your provider about disease prevention and staying healthy. In between checkups, there are plenty of things you can do on your own. Experts have done a lot of research about which lifestyle changes and preventive measures are most likely to keep you healthy. Ask your health care provider for more information. WEIGHT AND DIET  Eat a healthy diet  Be sure to include plenty of vegetables, fruits, low-fat dairy products, and lean protein.  Do not eat a lot of foods high in solid fats, added sugars, or salt.  Get regular exercise. This is one of the most important things you can do for your health.  Most adults should exercise for at least 150 minutes each week. The exercise should increase your heart rate and make you sweat (moderate-intensity exercise).  Most adults should also do strengthening exercises at least twice a week. This is in addition to the moderate-intensity exercise.  Maintain a healthy weight  Body mass index (BMI) is a measurement that can be used to identify possible weight problems. It estimates body fat based on height and weight. Your health care provider can help determine your BMI and help you achieve or maintain a healthy weight.  For females 25 years of age and older:   A BMI below 18.5 is considered underweight.  A BMI of 18.5 to 24.9 is normal.  A BMI of 25 to 29.9 is considered overweight.  A BMI of 30 and above is considered obese.  Watch levels of cholesterol and blood lipids  You should start having your blood tested for lipids and cholesterol at 46 years of age, then have this test every 5 years.  You may need to have your cholesterol levels checked more often if:  Your lipid or  cholesterol levels are high.  You are older than 46 years of age.  You are at high risk for heart disease.  CANCER SCREENING   Lung Cancer  Lung cancer screening is recommended for adults 97-92 years old who are at high risk for lung cancer because of a history of smoking.  A yearly low-dose CT scan of the lungs is recommended for people who:  Currently smoke.  Have quit within the past 15 years.  Have at least a 30-pack-year history of smoking. A pack year is smoking an average of one pack of cigarettes a day for 1 year.  Yearly screening should continue until it has been 15 years since you quit.  Yearly screening should stop if you develop a health problem that would prevent you from having lung cancer treatment.  Breast Cancer  Practice breast self-awareness. This means understanding how your breasts normally appear and feel.  It also means doing regular breast self-exams. Let your health care provider know about any changes, no matter how small.  If you are in your 20s or 30s, you should have a clinical breast exam (CBE) by a health care provider every 1-3 years as part of a regular health exam.  If you are 76 or older, have a CBE every year. Also consider having a breast X-ray (mammogram) every year.  If you have a family history of breast cancer, talk to your health care provider about genetic screening.  If you are  at high risk for breast cancer, talk to your health care provider about having an MRI and a mammogram every year.  Breast cancer gene (BRCA) assessment is recommended for women who have family members with BRCA-related cancers. BRCA-related cancers include:  Breast.  Ovarian.  Tubal.  Peritoneal cancers.  Results of the assessment will determine the need for genetic counseling and BRCA1 and BRCA2 testing. Cervical Cancer Routine pelvic examinations to screen for cervical cancer are no longer recommended for nonpregnant women who are considered low  risk for cancer of the pelvic organs (ovaries, uterus, and vagina) and who do not have symptoms. A pelvic examination may be necessary if you have symptoms including those associated with pelvic infections. Ask your health care provider if a screening pelvic exam is right for you.   The Pap test is the screening test for cervical cancer for women who are considered at risk.  If you had a hysterectomy for a problem that was not cancer or a condition that could lead to cancer, then you no longer need Pap tests.  If you are older than 65 years, and you have had normal Pap tests for the past 10 years, you no longer need to have Pap tests.  If you have had past treatment for cervical cancer or a condition that could lead to cancer, you need Pap tests and screening for cancer for at least 20 years after your treatment.  If you no longer get a Pap test, assess your risk factors if they change (such as having a new sexual partner). This can affect whether you should start being screened again.  Some women have medical problems that increase their chance of getting cervical cancer. If this is the case for you, your health care provider may recommend more frequent screening and Pap tests.  The human papillomavirus (HPV) test is another test that may be used for cervical cancer screening. The HPV test looks for the virus that can cause cell changes in the cervix. The cells collected during the Pap test can be tested for HPV.  The HPV test can be used to screen women 30 years of age and older. Getting tested for HPV can extend the interval between normal Pap tests from three to five years.  An HPV test also should be used to screen women of any age who have unclear Pap test results.  After 46 years of age, women should have HPV testing as often as Pap tests.  Colorectal Cancer  This type of cancer can be detected and often prevented.  Routine colorectal cancer screening usually begins at 46 years of  age and continues through 46 years of age.  Your health care provider may recommend screening at an earlier age if you have risk factors for colon cancer.  Your health care provider may also recommend using home test kits to check for hidden blood in the stool.  A small camera at the end of a tube can be used to examine your colon directly (sigmoidoscopy or colonoscopy). This is done to check for the earliest forms of colorectal cancer.  Routine screening usually begins at age 50.  Direct examination of the colon should be repeated every 5-10 years through 46 years of age. However, you may need to be screened more often if early forms of precancerous polyps or small growths are found. Skin Cancer  Check your skin from head to toe regularly.  Tell your health care provider about any new moles or changes in   moles, especially if there is a change in a mole's shape or color.  Also tell your health care provider if you have a mole that is larger than the size of a pencil eraser.  Always use sunscreen. Apply sunscreen liberally and repeatedly throughout the day.  Protect yourself by wearing long sleeves, pants, a wide-brimmed hat, and sunglasses whenever you are outside. HEART DISEASE, DIABETES, AND HIGH BLOOD PRESSURE   Have your blood pressure checked at least every 1-2 years. High blood pressure causes heart disease and increases the risk of stroke.  If you are between 75 years and 42 years old, ask your health care provider if you should take aspirin to prevent strokes.  Have regular diabetes screenings. This involves taking a blood sample to check your fasting blood sugar level.  If you are at a normal weight and have a low risk for diabetes, have this test once every three years after 46 years of age.  If you are overweight and have a high risk for diabetes, consider being tested at a younger age or more often. PREVENTING INFECTION  Hepatitis B  If you have a higher risk for  hepatitis B, you should be screened for this virus. You are considered at high risk for hepatitis B if:  You were born in a country where hepatitis B is common. Ask your health care provider which countries are considered high risk.  Your parents were born in a high-risk country, and you have not been immunized against hepatitis B (hepatitis B vaccine).  You have HIV or AIDS.  You use needles to inject street drugs.  You live with someone who has hepatitis B.  You have had sex with someone who has hepatitis B.  You get hemodialysis treatment.  You take certain medicines for conditions, including cancer, organ transplantation, and autoimmune conditions. Hepatitis C  Blood testing is recommended for:  Everyone born from [birth year] through 1967.  Anyone with known risk factors for hepatitis C. Sexually transmitted infections (STIs)  You should be screened for sexually transmitted infections (STIs) including gonorrhea and chlamydia if:  You are sexually active and are younger than 46 years of age.  You are older than 46 years of age and your health care provider tells you that you are at risk for this type of infection.  Your sexual activity has changed since you were last screened and you are at an increased risk for chlamydia or gonorrhea. Ask your health care provider if you are at risk.  If you do not have HIV, but are at risk, it may be recommended that you take a prescription medicine daily to prevent HIV infection. This is called pre-exposure prophylaxis (PrEP). You are considered at risk if:  You are sexually active and do not regularly use condoms or know the HIV status of your partner(s).  You take drugs by injection.  You are sexually active with a partner who has HIV. Talk with your health care provider about whether you are at high risk of being infected with HIV. If you choose to begin PrEP, you should first be tested for HIV. You should then be tested every 3 months for  as long as you are taking PrEP.  PREGNANCY   If you are premenopausal and you may become pregnant, ask your health care provider about preconception counseling.  If you may become pregnant, take 400 to 800 micrograms (mcg) of folic acid every day.  If you want to prevent pregnancy, talk to your  health care provider about birth control (contraception). OSTEOPOROSIS AND MENOPAUSE   Osteoporosis is a disease in which the bones lose minerals and strength with aging. This can result in serious bone fractures. Your risk for osteoporosis can be identified using a bone density scan.  If you are 65 years of age or older, or if you are at risk for osteoporosis and fractures, ask your health care provider if you should be screened.  Ask your health care provider whether you should take a calcium or vitamin D supplement to lower your risk for osteoporosis.  Menopause may have certain physical symptoms and risks.  Hormone replacement therapy may reduce some of these symptoms and risks. Talk to your health care provider about whether hormone replacement therapy is right for you.  HOME CARE INSTRUCTIONS   Schedule regular health, dental, and eye exams.  Stay current with your immunizations.   Do not use any tobacco products including cigarettes, chewing tobacco, or electronic cigarettes.  If you are pregnant, do not drink alcohol.  If you are breastfeeding, limit how much and how often you drink alcohol.  Limit alcohol intake to no more than 1 drink per day for nonpregnant women. One drink equals 12 ounces of beer, 5 ounces of wine, or 1 ounces of hard liquor.  Do not use street drugs.  Do not share needles.  Ask your health care provider for help if you need support or information about quitting drugs.  Tell your health care provider if you often feel depressed.  Tell your health care provider if you have ever been abused or do not feel safe at home. Document Released: 02/06/2011  Document Revised: 12/08/2013 Document Reviewed: 06/25/2013 Exit[employer] Patient Information 2015 [employer]. This information is not intended to replace advice given to you by your health care provider. Make sure you discuss any questions you have with your health care provider. Menopause Menopause is the normal time of life when menstrual periods stop completely. Menopause is complete when you have missed 12 consecutive menstrual periods. It usually occurs between the ages of 48 years and 55 years. Very rarely does a woman develop menopause before the age of 40 years. At menopause, your ovaries stop producing the female hormones estrogen and progesterone. This can cause undesirable symptoms and also affect your health. Sometimes the symptoms may occur 4-5 years before the menopause begins. There is no relationship between menopause and:  Oral contraceptives.  Number of children you had.  Race.  The age your menstrual periods started (menarche). Heavy smokers and very thin women may develop menopause earlier in life. CAUSES  The ovaries stop producing the female hormones estrogen and progesterone.  Other causes include:  Surgery to remove both ovaries.  The ovaries stop functioning for no known reason.  Tumors of the pituitary gland in the brain.  Medical disease that affects the ovaries and hormone production.  Radiation treatment to the abdomen or pelvis.  Chemotherapy that affects the ovaries. SYMPTOMS   Hot flashes.  Night sweats.  Decrease in sex drive.  Vaginal dryness and thinning of the vagina causing painful intercourse.  Dryness of the skin and developing wrinkles.  Headaches.  Tiredness.  Irritability.  Memory problems.  Weight gain.  Bladder infections.  Hair growth of the face and chest.  Infertility. More serious symptoms include:  Loss of bone (osteoporosis) causing breaks (fractures).  Depression.  Hardening and narrowing of the arteries  (atherosclerosis) causing heart attacks and strokes. DIAGNOSIS   When the menstrual   periods have stopped for 12 straight months.  Physical exam.  Hormone studies of the blood. TREATMENT  There are many treatment choices and nearly as many questions about them. The decisions to treat or not to treat menopausal changes is an individual choice made with your health care provider. Your health care provider can discuss the treatments with you. Together, you can decide which treatment will work best for you. Your treatment choices may include:   Hormone therapy (estrogen and progesterone).  Non-hormonal medicines.  Treating the individual symptoms with medicine (for example antidepressants for depression).  Herbal medicines that may help specific symptoms.  Counseling by a psychiatrist or psychologist.  Group therapy.  Lifestyle changes including:  Eating healthy.  Regular exercise.  Limiting caffeine and alcohol.  Stress management and meditation.  No treatment. HOME CARE INSTRUCTIONS   Take the medicine your health care provider gives you as directed.  Get plenty of sleep and rest.  Exercise regularly.  Eat a diet that contains calcium (good for the bones) and soy products (acts like estrogen hormone).  Avoid alcoholic beverages.  Do not smoke.  If you have hot flashes, dress in layers.  Take supplements, calcium, and vitamin D to strengthen bones.  You can use over-the-counter lubricants or moisturizers for vaginal dryness.  Group therapy is sometimes very helpful.  Acupuncture may be helpful in some cases. SEEK MEDICAL CARE IF:   You are not sure you are in menopause.  You are having menopausal symptoms and need advice and treatment.  You are still having menstrual periods after age 55 years.  You have pain with intercourse.  Menopause is complete (no menstrual period for 12 months) and you develop vaginal bleeding.  You need a referral to a  specialist (gynecologist, psychiatrist, or psychologist) for treatment. SEEK IMMEDIATE MEDICAL CARE IF:   You have severe depression.  You have excessive vaginal bleeding.  You fell and think you have a broken bone.  You have pain when you urinate.  You develop leg or chest pain.  You have a fast pounding heart beat (palpitations).  You have severe headaches.  You develop vision problems.  You feel a lump in your breast.  You have abdominal pain or severe indigestion. Document Released: 10/14/2003 Document Revised: 03/26/2013 Document Reviewed: 02/20/2013 Exit[employer] Patient Information 2015 [employer]. This information is not intended to replace advice given to you by your health care provider. Make sure you discuss any questions you have with your health care provider.  

## 2015-02-23 LAB — FOLLICLE STIMULATING HORMONE: FSH: 27 m[IU]/mL

## 2015-02-23 LAB — CYTOLOGY - PAP

## 2016-02-10 ENCOUNTER — Other Ambulatory Visit: Payer: Self-pay | Admitting: Women's Health

## 2016-02-10 DIAGNOSIS — Z1231 Encounter for screening mammogram for malignant neoplasm of breast: Secondary | ICD-10-CM

## 2016-02-16 ENCOUNTER — Ambulatory Visit (HOSPITAL_COMMUNITY)
Admission: RE | Admit: 2016-02-16 | Discharge: 2016-02-16 | Disposition: A | Discharge status: Home / Self Care | Payer: BC Managed Care – PPO | Source: Ambulatory Visit | Attending: Women's Health | Admitting: Women's Health

## 2016-02-16 DIAGNOSIS — Z1231 Encounter for screening mammogram for malignant neoplasm of breast: Secondary | ICD-10-CM | POA: Insufficient documentation

## 2016-02-23 ENCOUNTER — Encounter: Payer: Self-pay | Admitting: Women's Health

## 2016-02-23 ENCOUNTER — Ambulatory Visit (INDEPENDENT_AMBULATORY_CARE_PROVIDER_SITE_OTHER): Payer: BC Managed Care – PPO | Admitting: Women's Health

## 2016-02-23 VITALS — BP 128/78 | Ht 64.0 in | Wt 219.0 lb

## 2016-02-23 DIAGNOSIS — Z01419 Encounter for gynecological examination (general) (routine) without abnormal findings: Secondary | ICD-10-CM | POA: Diagnosis not present

## 2016-02-23 DIAGNOSIS — Z13220 Encounter for screening for lipoid disorders: Secondary | ICD-10-CM | POA: Diagnosis not present

## 2016-02-23 DIAGNOSIS — N926 Irregular menstruation, unspecified: Secondary | ICD-10-CM | POA: Diagnosis not present

## 2016-02-23 LAB — CBC WITH DIFFERENTIAL/PLATELET
Basophils Absolute: 69 cells/uL (ref 0–200)
Basophils Relative: 1 %
EOS ABS: 207 {cells}/uL (ref 15–500)
Eosinophils Relative: 3 %
HCT: 36.4 % (ref 35.0–45.0)
Hemoglobin: 11.3 g/dL — ABNORMAL LOW (ref 11.7–15.5)
LYMPHS PCT: 37 %
Lymphs Abs: 2553 cells/uL (ref 850–3900)
MCH: 21.8 pg — ABNORMAL LOW (ref 27.0–33.0)
MCHC: 31 g/dL — ABNORMAL LOW (ref 32.0–36.0)
MCV: 70.1 fL — ABNORMAL LOW (ref 80.0–100.0)
MPV: 9.1 fL (ref 7.5–12.5)
Monocytes Absolute: 621 cells/uL (ref 200–950)
Monocytes Relative: 9 %
Neutro Abs: 3450 cells/uL (ref 1500–7800)
Neutrophils Relative %: 50 %
Platelets: 329 10*3/uL (ref 140–400)
RBC: 5.19 MIL/uL — ABNORMAL HIGH (ref 3.80–5.10)
RDW: 17.6 % — ABNORMAL HIGH (ref 11.0–15.0)
WBC: 6.9 10*3/uL (ref 3.8–10.8)

## 2016-02-23 NOTE — Progress Notes (Signed)
Sara Harding 29-Sep-1970 [id number]    History:    Presents for annual exam.  History of irregular cycles has had 2 five day cycles this past year. Denies spotting. Not sexually active in years. FSH 27  02/2015. 2016 had 4-5 cycles. Has occasional hot flushes not daily. Normal Pap and mammogram history.  Past medical history, past surgical history, family history and social history were all reviewed and documented in the EPIC chart. 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  ROS:  A ROS was performed and pertinent positives and negatives are included.  Exam:  Filed Vitals:   02/23/16 1019  BP: 128/78    General appearance:  Normal Thyroid:  Symmetrical, normal in size, without palpable masses or nodularity. Respiratory  Auscultation:  Clear without wheezing or rhonchi Cardiovascular  Auscultation:  Regular rate, without rubs, murmurs or gallops  Edema/varicosities:  Not grossly evident Abdominal  Soft,nontender, without masses, guarding or rebound.  Liver/spleen:  No organomegaly noted  Hernia:  None appreciated  Skin  Inspection:  Grossly normal   Breasts: Examined lying and sitting.     Right: Without masses, retractions, discharge or axillary adenopathy.     Left: Without masses, retractions, discharge or axillary adenopathy. Gentitourinary   Inguinal/mons:  Normal without inguinal adenopathy  External genitalia:  Normal  BUS/Urethra/Skene's glands:  Normal  Vagina:  Normal  Cervix:  Normal  Uterus:   normal in size, shape and contour.  Midline and mobile  Adnexa/parametria:     Rt: Without masses or tenderness.   Lt: Without masses or tenderness.  Anus and perineum: Normal  Digital rectal exam: Normal sphincter tone without palpated masses or tenderness  Assessment/Plan:  45 y.o. S WF G0 for annual exam with no complaints.  Perimenopausal Obesity  Plan: FSH, reviewed if low will withdrawal with Provera if no cycles  every 3 months. SBE's, continue annual screening mammogram, calcium rich diet, vitamin D 1000 daily encouraged. Encouraged to increase regular exercise and decrease calories for weight loss. CBC, lipid panel, CMP, vitamin D, UA, Pap normal with negative HR HPV 2016, new screening guidelines reviewed.    Harrington ChallengerYOUNG,Sara Harding WHNP, 11:00 AM 02/23/2016

## 2016-02-23 NOTE — Patient Instructions (Signed)

## 2016-02-24 LAB — LIPID PANEL
Cholesterol: 186 mg/dL (ref 125–200)
HDL: 57 mg/dL (ref >=46)
LDL Cholesterol: 107 mg/dL (ref <130)
TRIGLYCERIDES: 110 mg/dL (ref <150)
Total CHOL/HDL Ratio: 3.3 Ratio (ref <=5.0)
VLDL: 22 mg/dL (ref <30)

## 2016-02-24 LAB — URINALYSIS W MICROSCOPIC + REFLEX CULTURE
Bacteria, UA: NONE SEEN [HPF]
Bilirubin Urine: NEGATIVE
Casts: NONE SEEN [LPF]
Crystals: NONE SEEN [HPF]
Glucose, UA: NEGATIVE
HGB URINE DIPSTICK: NEGATIVE
KETONES UR: NEGATIVE
Leukocytes, UA: NEGATIVE
NITRITE: NEGATIVE
PH: 5.5 (ref 5.0–8.0)
Protein, ur: NEGATIVE
RBC / HPF: NONE SEEN RBC/HPF (ref <=2)
Specific Gravity, Urine: 1.019 (ref 1.001–1.035)
WBC, UA: NONE SEEN WBC/HPF (ref <=5)
Yeast: NONE SEEN [HPF]

## 2016-02-24 LAB — COMPREHENSIVE METABOLIC PANEL
ALT: 20 U/L (ref 6–29)
AST: 17 U/L (ref 10–35)
Albumin: 4.1 g/dL (ref 3.6–5.1)
Alkaline Phosphatase: 127 U/L — ABNORMAL HIGH (ref 33–115)
BUN: 15 mg/dL (ref 7–25)
CO2: 23 mmol/L (ref 20–31)
CREATININE: 0.68 mg/dL (ref 0.50–1.10)
Calcium: 9.5 mg/dL (ref 8.6–10.2)
Chloride: 104 mmol/L (ref 98–110)
Glucose, Bld: 88 mg/dL (ref 65–99)
Potassium: 4.2 mmol/L (ref 3.5–5.3)
SODIUM: 140 mmol/L (ref 135–146)
Total Bilirubin: 0.4 mg/dL (ref 0.2–1.2)
Total Protein: 7.2 g/dL (ref 6.1–8.1)

## 2016-02-24 LAB — VITAMIN D 25 HYDROXY (VIT D DEFICIENCY, FRACTURES): VIT D 25 HYDROXY: 30 ng/mL (ref 30–100)

## 2016-02-24 LAB — FOLLICLE STIMULATING HORMONE: FSH: 61.9 m[IU]/mL

## 2016-12-20 ENCOUNTER — Encounter: Payer: Self-pay | Admitting: Gynecology

## 2017-02-23 ENCOUNTER — Encounter: Payer: BC Managed Care – PPO | Admitting: Women's Health

## 2017-02-26 ENCOUNTER — Encounter: Payer: BC Managed Care – PPO | Admitting: Women's Health

## 2017-03-12 ENCOUNTER — Other Ambulatory Visit: Payer: Self-pay | Admitting: Women's Health

## 2017-03-12 DIAGNOSIS — Z1231 Encounter for screening mammogram for malignant neoplasm of breast: Secondary | ICD-10-CM

## 2017-03-14 ENCOUNTER — Ambulatory Visit (HOSPITAL_COMMUNITY)
Admission: RE | Admit: 2017-03-14 | Discharge: 2017-03-14 | Disposition: A | Discharge status: Home / Self Care | Payer: BC Managed Care – PPO | Source: Ambulatory Visit | Attending: Women's Health | Admitting: Women's Health

## 2017-03-14 DIAGNOSIS — Z1231 Encounter for screening mammogram for malignant neoplasm of breast: Secondary | ICD-10-CM | POA: Insufficient documentation

## 2017-03-18 ENCOUNTER — Encounter: Payer: Self-pay | Admitting: Women's Health

## 2018-03-11 ENCOUNTER — Other Ambulatory Visit: Payer: Self-pay | Admitting: Women's Health

## 2018-03-11 DIAGNOSIS — Z1231 Encounter for screening mammogram for malignant neoplasm of breast: Secondary | ICD-10-CM

## 2018-03-15 ENCOUNTER — Ambulatory Visit (HOSPITAL_COMMUNITY)
Admission: RE | Admit: 2018-03-15 | Discharge: 2018-03-15 | Disposition: A | Discharge status: Home / Self Care | Payer: BC Managed Care – PPO | Source: Ambulatory Visit | Attending: Women's Health | Admitting: Women's Health

## 2018-03-15 DIAGNOSIS — Z1231 Encounter for screening mammogram for malignant neoplasm of breast: Secondary | ICD-10-CM | POA: Insufficient documentation

## 2019-03-13 ENCOUNTER — Other Ambulatory Visit (HOSPITAL_COMMUNITY): Payer: Self-pay | Admitting: Women's Health

## 2019-03-13 DIAGNOSIS — Z1231 Encounter for screening mammogram for malignant neoplasm of breast: Secondary | ICD-10-CM

## 2019-03-20 ENCOUNTER — Other Ambulatory Visit: Payer: Self-pay

## 2019-03-20 ENCOUNTER — Ambulatory Visit (HOSPITAL_COMMUNITY)
Admission: RE | Admit: 2019-03-20 | Discharge: 2019-03-20 | Disposition: A | Discharge status: Home / Self Care | Payer: BC Managed Care – PPO | Source: Ambulatory Visit | Attending: Women's Health | Admitting: Women's Health

## 2019-03-20 ENCOUNTER — Encounter: Payer: Self-pay | Admitting: Women's Health

## 2019-03-20 DIAGNOSIS — Z1231 Encounter for screening mammogram for malignant neoplasm of breast: Secondary | ICD-10-CM | POA: Insufficient documentation

## 2019-07-10 ENCOUNTER — Other Ambulatory Visit: Payer: Self-pay

## 2019-07-10 DIAGNOSIS — Z20822 Contact with and (suspected) exposure to covid-19: Secondary | ICD-10-CM

## 2019-07-14 LAB — NOVEL CORONAVIRUS, NAA: SARS-CoV-2, NAA: NOT DETECTED

## 2019-10-12 ENCOUNTER — Ambulatory Visit: Payer: BC Managed Care – PPO

## 2020-04-15 IMAGING — MG DIGITAL SCREENING BILATERAL MAMMOGRAM WITH TOMO AND CAD
6 of 10 series · 6 of 30 positions shown · non-contrast
Comparison: Previous exam(s).

CLINICAL DATA: Screening.

EXAM:
DIGITAL SCREENING BILATERAL MAMMOGRAM WITH TOMO AND CAD

[L CC synth-2D]
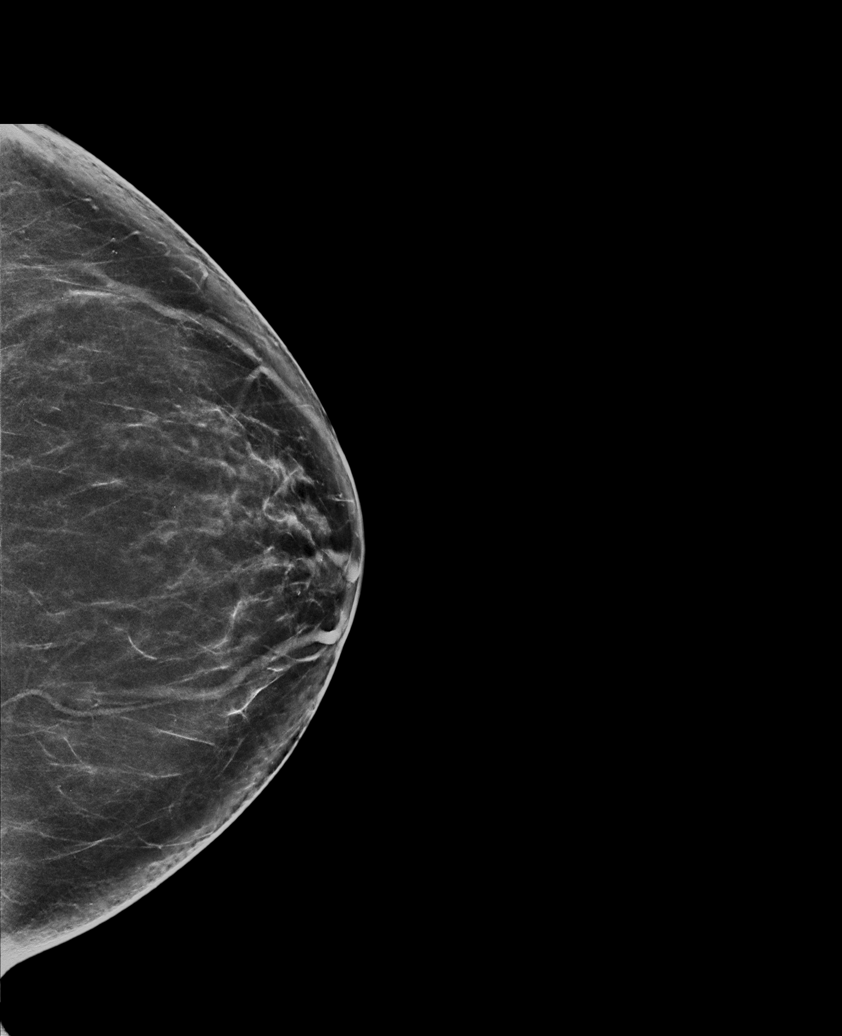

[L MLO synth-2D (1 of 2)]
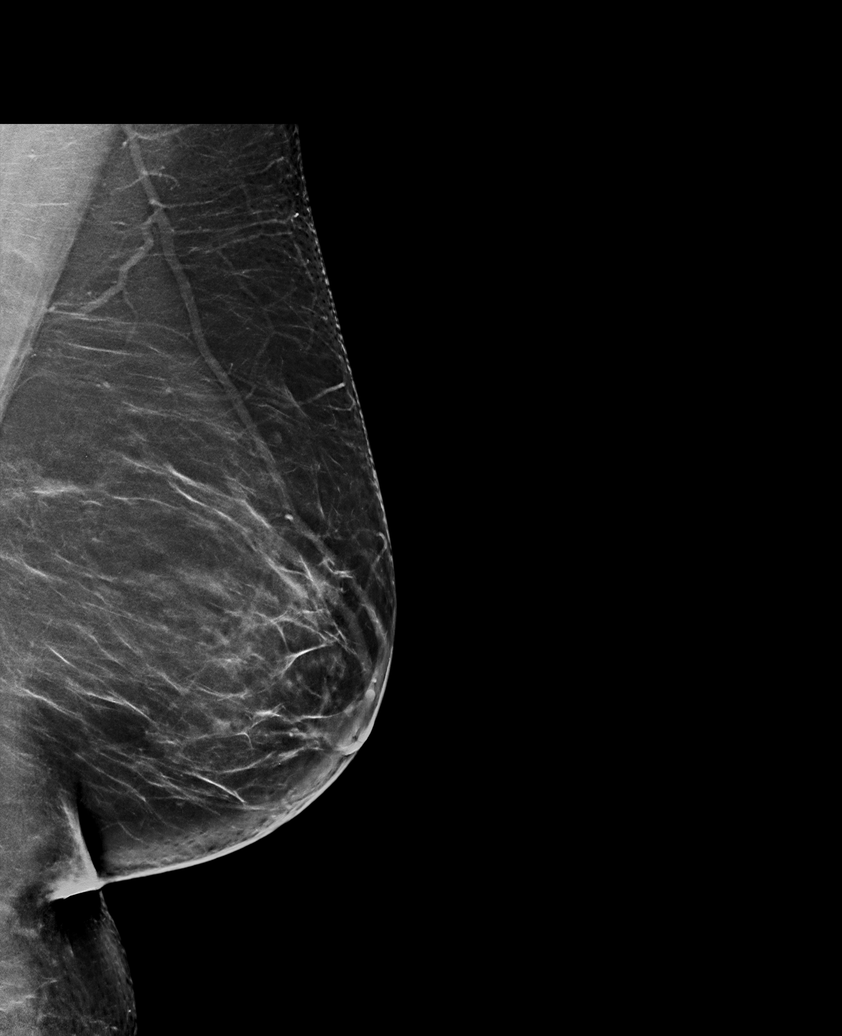

[R MLO synth-2D]
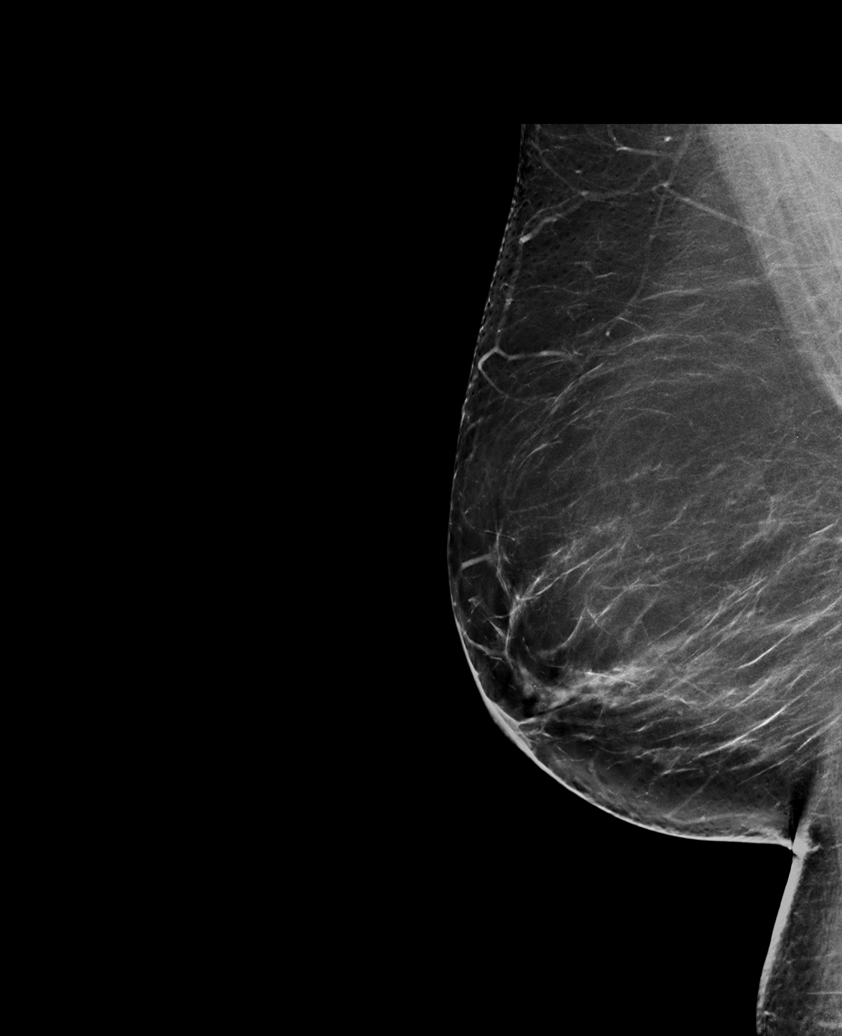

[R CC synth-2D]
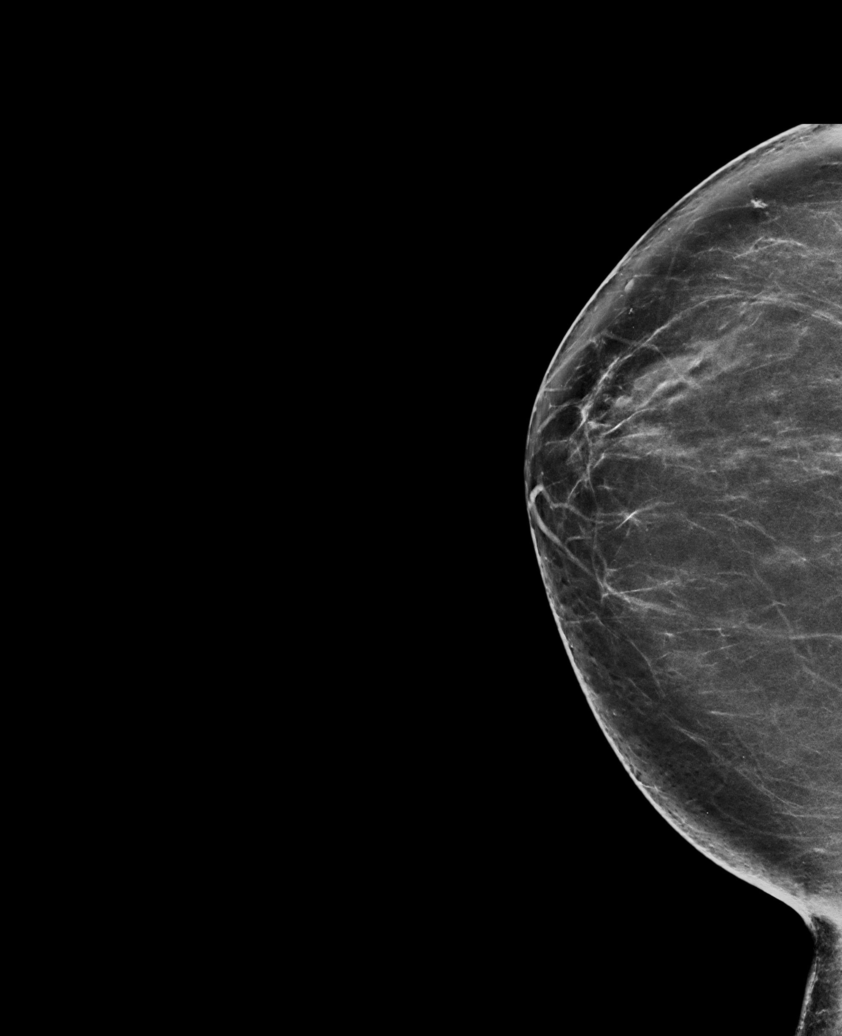

[L MLO synth-2D (2 of 2)]
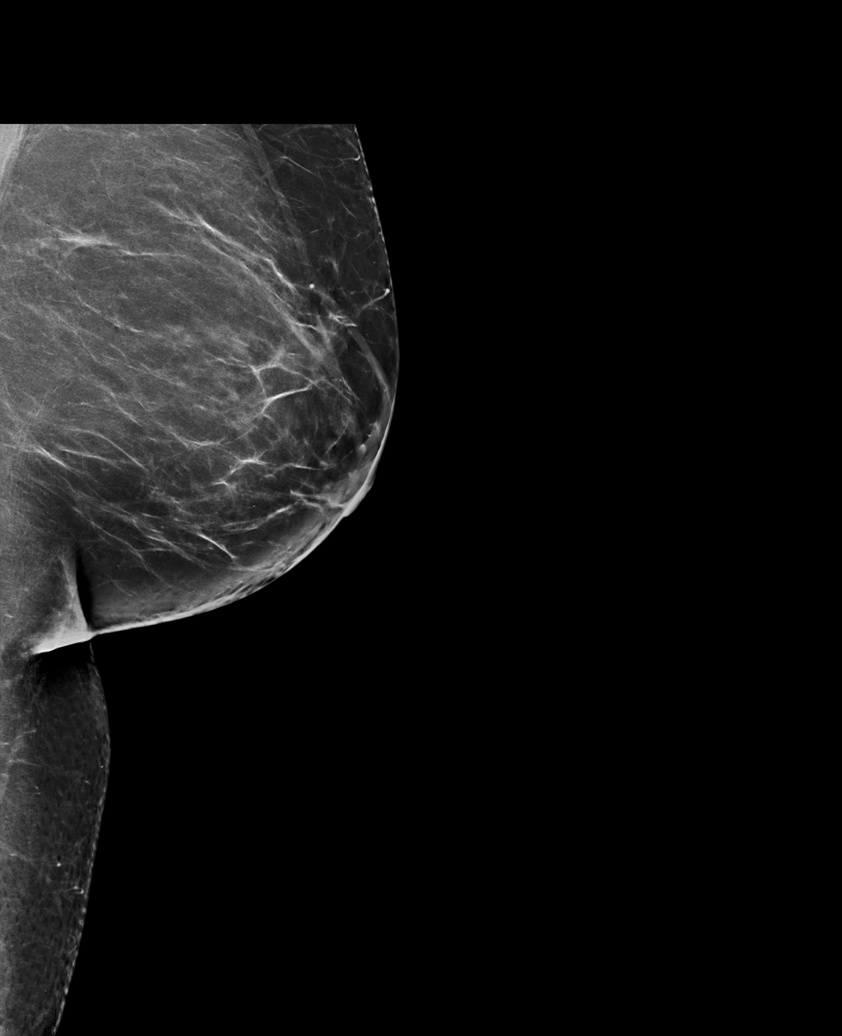

[R CC tomo · tomo slice 41/81.0]
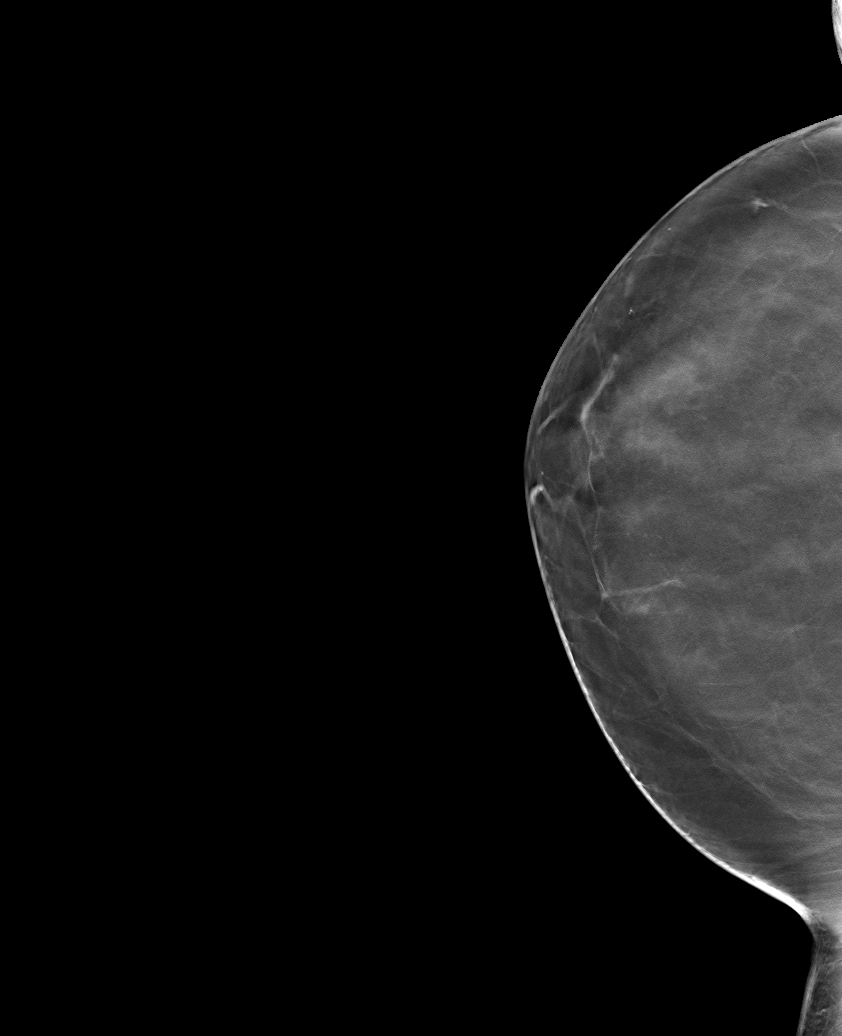

[6 of 30 positions shown; findings below may reference images not displayed]

ACR Breast Density Category b: There are scattered areas of
fibroglandular density.
FINDINGS: There are no findings suspicious for malignancy. Images were
processed with CAD.
IMPRESSION: No mammographic evidence of malignancy. A result letter of this
screening mammogram will be mailed directly to the patient.

RECOMMENDATION:
Screening mammogram in one year. (Code:[id number])

BI-RADS CATEGORY  1: Negative.

## 2021-02-23 ENCOUNTER — Other Ambulatory Visit (HOSPITAL_COMMUNITY): Payer: Self-pay | Admitting: Surgical

## 2021-02-23 ENCOUNTER — Other Ambulatory Visit (HOSPITAL_COMMUNITY): Payer: Self-pay | Admitting: Adult Health

## 2021-02-23 ENCOUNTER — Other Ambulatory Visit (HOSPITAL_COMMUNITY): Payer: Self-pay

## 2021-02-23 DIAGNOSIS — Z1231 Encounter for screening mammogram for malignant neoplasm of breast: Secondary | ICD-10-CM

## 2021-03-03 ENCOUNTER — Other Ambulatory Visit: Payer: Self-pay

## 2021-03-03 ENCOUNTER — Ambulatory Visit (HOSPITAL_COMMUNITY)
Admission: RE | Admit: 2021-03-03 | Discharge: 2021-03-03 | Disposition: A | Discharge status: Home / Self Care | Payer: BC Managed Care – PPO | Source: Ambulatory Visit | Attending: Adult Health | Admitting: Adult Health

## 2021-03-03 DIAGNOSIS — Z1231 Encounter for screening mammogram for malignant neoplasm of breast: Secondary | ICD-10-CM

## 2021-05-17 ENCOUNTER — Encounter: Payer: Self-pay | Admitting: Internal Medicine

## 2021-06-20 ENCOUNTER — Ambulatory Visit: Payer: BC Managed Care – PPO | Admitting: Obstetrics & Gynecology

## 2021-06-20 ENCOUNTER — Encounter: Payer: Self-pay | Admitting: Obstetrics & Gynecology

## 2021-06-20 ENCOUNTER — Other Ambulatory Visit (HOSPITAL_COMMUNITY)
Admission: RE | Admit: 2021-06-20 | Discharge: 2021-06-20 | Disposition: A | Discharge status: Home / Self Care | Payer: BC Managed Care – PPO | Source: Ambulatory Visit | Attending: Obstetrics & Gynecology | Admitting: Obstetrics & Gynecology

## 2021-06-20 ENCOUNTER — Other Ambulatory Visit: Payer: Self-pay

## 2021-06-20 VITALS — BP 143/87 | HR 63 | Ht 64.0 in | Wt 221.0 lb

## 2021-06-20 DIAGNOSIS — Z1211 Encounter for screening for malignant neoplasm of colon: Secondary | ICD-10-CM

## 2021-06-20 DIAGNOSIS — Z1212 Encounter for screening for malignant neoplasm of rectum: Secondary | ICD-10-CM | POA: Diagnosis not present

## 2021-06-20 DIAGNOSIS — Z01419 Encounter for gynecological examination (general) (routine) without abnormal findings: Secondary | ICD-10-CM

## 2021-06-20 LAB — HEMOCCULT GUIAC POC 1CARD (OFFICE): Fecal Occult Blood, POC: NEGATIVE

## 2021-06-20 NOTE — Progress Notes (Signed)
Chief Complaint  Patient presents with   Gynecologic Exam   New Patient (Initial Visit)      50 y.o. G0P0 Patient's last menstrual period was 02/04/2018. The current method of family planning is post menopausal status.  Outpatient Encounter Medications as of 06/20/2021  Medication Sig   cetirizine (ZYRTEC) 10 MG tablet Take by mouth.   cholecalciferol (VITAMIN D) 25 MCG (1000 UNIT) tablet Take 1,000 Units by mouth daily.   lisinopril (ZESTRIL) 10 MG tablet Take 10 mg by mouth daily.   Multiple Vitamin (MULTIVITAMIN) tablet Take 1 tablet by mouth daily.   Omega 3-5-6-7-9 Fatty Acids (COMPLETE OMEGA) CAPS Take 700-1,500 mg by mouth.   Probiotic Product (DAILY DIGESTIVE PROBIOTIC PO) Take 9,000 Units by mouth.   [DISCONTINUED] Cholecalciferol (VITAMIN D) 400 UNITS capsule Take 400 Units by mouth daily.   No facility-administered encounter medications on file as of 06/20/2021.    Subjective No complaints No gyn issues No periods or bleeding since 2019 Past Medical History:  Diagnosis Date   Chronic rhinitis    Fungal dermatitis    Hypertension    Obesity     Past Surgical History:  Procedure Laterality Date   MOLE REMOVAL Right 1989   Behind ear   PLANTAR'S WART EXCISION Left 1986   TONSILLECTOMY  1973    OB History     Gravida  0   Para      Term      Preterm      AB      Living         SAB      IAB      Ectopic      Multiple      Live Births              Allergies  Allergen Reactions   Diphenhydramine Hcl     REACTION: hives    Social History   Socioeconomic History   Marital status: Married    Spouse name: Not on file   Number of children: Not on file   Years of education: Not on file   Highest education level: Not on file  Occupational History   Not on file  Tobacco Use   Smoking status: Never   Smokeless tobacco: Never  Vaping Use   Vaping Use: Never used  Substance and Sexual Activity   Alcohol use: Yes     Alcohol/week: 0.0 standard drinks    Comment: socially   Drug use: No   Sexual activity: Not Currently    Birth control/protection: Post-menopausal  Other Topics Concern   Not on file  Social History Narrative   Not on file   Social Determinants of Health   Financial Resource Strain: Low Risk    Difficulty of Paying Living Expenses: Not very hard  Food Insecurity: No Food Insecurity   Worried About Running Out of Food in the Last Year: Never true   Ran Out of Food in the Last Year: Never true  Transportation Needs: No Transportation Needs   Lack of Transportation (Medical): No   Lack of Transportation (Non-Medical): No  Physical Activity: Insufficiently Active   Days of Exercise per Week: 3 days   Minutes of Exercise per Session: 40 min  Stress: No Stress Concern Present   Feeling of Stress : Only a little  Social Connections: Unknown   Frequency of Communication with Friends and Family: Once a week   Frequency of Social Gatherings with  Friends and Family: Once a week   Attends Religious Services: Patient refused   Active Member of Clubs or Organizations: Yes   Attends Engineer, structural: More than 4 times per year   Marital Status: Married    Family History  Problem Relation Age of Onset   Bone cancer Maternal Grandmother    Emphysema Maternal Grandmother    Rheumatic fever Maternal Grandfather    Heart disease Father    Diabetes Mother    Hypertension Mother    Heart disease Mother    Arthritis Mother    Hyperlipidemia Mother    Kidney disease Mother    Ulcers Mother    Sleep apnea Mother    Anemia Mother     Medications:       Current Outpatient Medications:    cetirizine (ZYRTEC) 10 MG tablet, Take by mouth., Disp: , Rfl:    cholecalciferol (VITAMIN D) 25 MCG (1000 UNIT) tablet, Take 1,000 Units by mouth daily., Disp: , Rfl:    lisinopril (ZESTRIL) 10 MG tablet, Take 10 mg by mouth daily., Disp: , Rfl:    Multiple Vitamin (MULTIVITAMIN) tablet,  Take 1 tablet by mouth daily., Disp: , Rfl:    Omega 3-5-6-7-9 Fatty Acids (COMPLETE OMEGA) CAPS, Take 700-1,500 mg by mouth., Disp: , Rfl:    Probiotic Product (DAILY DIGESTIVE PROBIOTIC PO), Take 9,000 Units by mouth., Disp: , Rfl:   Objective Blood pressure (!) 143/87, pulse 63, height 5\' 4"  (1.626 m), weight 221 lb (100.2 kg), last menstrual period 02/04/2018. Blood pressure (!) 143/87, pulse 63, height 5\' 4"  (1.626 m), weight 221 lb (100.2 kg), last menstrual period 02/04/2018. General WDWN female NAD Vulva:  normal appearing vulva with no masses, tenderness or lesions Vagina:  normal mucosa, no discharge Cervix:  Normal no lesions Uterus:  normal size, contour, position, consistency, mobility, non-tender Adnexa: ovaries:present,  normal adnexa in size, nontender and no masses Rectal normal tone, negative masses, heme negative   Pertinent ROS No burning with urination, frequency or urgency No nausea, vomiting or diarrhea Nor fever chills or other constitutional symptoms   Labs or studies     Impression Diagnoses this Encounter::   ICD-10-CM   1. Encounter for gynecological examination without abnormal finding  Z01.419     2. Encounter for gynecological examination with Papanicolaou smear of cervix  Z01.419 Cytology - PAP    3. Screening for colorectal cancer  Z12.11    Z12.12       Established relevant diagnosis(es):   Plan/Recommendations: No orders of the defined types were placed in this encounter.   Labs or Scans Ordered: No orders of the defined types were placed in this encounter.   Management:: Yearly q3-5 years  Follow up Return in about 3 years (around 06/20/2024) for yearly, with Dr [phone].      All questions were answered.

## 2021-06-20 NOTE — Addendum Note (Signed)
Addended by: Leilani Able, Ejay Lashley A on: 06/20/2021 04:30 PM   Modules accepted: Orders

## 2021-06-27 LAB — CYTOLOGY - PAP
Adequacy: ABSENT
Comment: NEGATIVE
Diagnosis: NEGATIVE
High risk HPV: NEGATIVE

## 2021-07-05 ENCOUNTER — Other Ambulatory Visit: Payer: Self-pay

## 2021-07-05 ENCOUNTER — Ambulatory Visit (INDEPENDENT_AMBULATORY_CARE_PROVIDER_SITE_OTHER): Payer: Self-pay | Admitting: *Deleted

## 2021-07-05 ENCOUNTER — Encounter: Payer: Self-pay | Admitting: *Deleted

## 2021-07-05 VITALS — Ht 64.0 in | Wt 218.6 lb

## 2021-07-05 DIAGNOSIS — Z1211 Encounter for screening for malignant neoplasm of colon: Secondary | ICD-10-CM

## 2021-07-05 MED ORDER — CLENPIQ 10-3.5-12 MG-GM -GM/160ML PO SOLN: 1.0000 | Freq: Once | ORAL | 0 refills | Status: AC

## 2021-07-05 NOTE — Progress Notes (Signed)
Gastroenterology Pre-Procedure Review  Request Date: 07/05/2021 Requesting Physician: Colleen Can, AGNP, no previous TCS  PATIENT REVIEW QUESTIONS: The patient responded to the following health history questions as indicated:    1. Diabetes Melitis: no 2. Joint replacements in the past 12 months: no 3. Major health problems in the past 3 months: no 4. Has an artificial valve or MVP: no 5. Has a defibrillator: no 6. Has been advised in past to take antibiotics in advance of a procedure like teeth cleaning: no 7. Family history of colon cancer: no  8. Alcohol Use: yes, 2 drinks a month 9. Illicit drug Use: no 10. History of sleep apnea: no  11. History of coronary artery or other vascular stents placed within the last 12 months: no 12. History of any prior anesthesia complications: no 13. Body mass index is 37.52 kg/m.    MEDICATIONS & ALLERGIES:    Patient reports the following regarding taking any blood thinners:   Plavix? no Aspirin? no Coumadin? no Brilinta? no Xarelto? no Eliquis? no Pradaxa? no Savaysa? no Effient? no  Patient confirms/reports the following medications:  Current Outpatient Medications  Medication Sig Dispense Refill   cetirizine (ZYRTEC) 10 MG tablet Take by mouth daily at 6 (six) AM.     cholecalciferol (VITAMIN D) 25 MCG (1000 UNIT) tablet Take 1,000 Units by mouth daily.     lisinopril (ZESTRIL) 10 MG tablet Take 10 mg by mouth daily.     Multiple Vitamin (MULTIVITAMIN) tablet Take 1 tablet by mouth daily.     Omega 3-5-6-7-9 Fatty Acids (COMPLETE OMEGA) CAPS Take 700-1,500 mg by mouth daily at 6 (six) AM.     Probiotic Product (DAILY DIGESTIVE PROBIOTIC PO) Take 9,000 Units by mouth.     No current facility-administered medications for this visit.    Patient confirms/reports the following allergies:  Allergies  Allergen Reactions   Diphenhydramine Hcl     REACTION: hives    No orders of the defined types were placed in this  encounter.   AUTHORIZATION INFORMATION Primary Insurance: [facility],  [place] #: [id number],  Group #: [id number] Pre-Cert / Berkley Harvey required: No, not required  SCHEDULE INFORMATION: Procedure has been scheduled as follows:  Date: 07/26/2021, Time: 1:45  Location: [facility] with Dr. Marletta Lor  This Gastroenterology Pre-Precedure Review Form is being routed to the following provider(s): Ermalinda Memos, PA-C

## 2021-07-07 ENCOUNTER — Encounter: Payer: Self-pay | Admitting: *Deleted

## 2021-07-07 ENCOUNTER — Telehealth: Payer: Self-pay | Admitting: *Deleted

## 2021-07-07 NOTE — Telephone Encounter (Signed)
PATIENT RETURNED CALL  

## 2021-07-07 NOTE — Progress Notes (Addendum)
Pt told me during her triage visit that she has not had a period in over 2 years.  Tried to call pt to inform her to hold any multivitamins with Iron 7 days prior to procedure.  Had to [facility] for her to call me back.  Mailed letter to pt.

## 2021-07-07 NOTE — Progress Notes (Signed)
OK to schedule. ASA II.   If multivitamin contains iron, she should hold this x 7 days prior.   If still menstruating, she will need a urine pregnancy test.

## 2021-07-08 NOTE — Telephone Encounter (Signed)
Pt called back.  She was informed  to hold any multivitamins with Iron 7 days prior to procedure. Pt made aware that I mailed a letter out to her.

## 2021-07-08 NOTE — Telephone Encounter (Signed)
Tried to call pt back.  LMOM for pt to call me back.

## 2021-07-08 NOTE — Progress Notes (Signed)
Pt called back.  She was informed  to hold any multivitamins with Iron 7 days prior to procedure. Pt made aware that I mailed a letter out to her.

## 2021-07-18 ENCOUNTER — Telehealth: Payer: Self-pay | Admitting: Internal Medicine

## 2021-07-18 NOTE — Telephone Encounter (Signed)
Spoke to pharmacy rep and confirmed that they do have RX from 07/05/2021.  Called pt and informed her to call them and request it to be filled.  She voiced understanding.

## 2021-07-18 NOTE — Telephone Encounter (Signed)
Pt is scheduled procedure with Dr Marletta Lor on 12/20 and said that her pharmacy doesn't have her prep. [facility]. [phone]

## 2021-07-26 ENCOUNTER — Ambulatory Visit (HOSPITAL_COMMUNITY)
Admission: RE | Admit: 2021-07-26 | Discharge: 2021-07-26 | Disposition: A | Discharge status: Home / Self Care | Payer: BC Managed Care – PPO | Source: Ambulatory Visit | Attending: Internal Medicine | Admitting: Internal Medicine

## 2021-07-26 ENCOUNTER — Encounter (HOSPITAL_COMMUNITY): Payer: Self-pay

## 2021-07-26 ENCOUNTER — Ambulatory Visit (HOSPITAL_COMMUNITY): Payer: BC Managed Care – PPO | Admitting: Anesthesiology

## 2021-07-26 ENCOUNTER — Other Ambulatory Visit: Payer: Self-pay

## 2021-07-26 ENCOUNTER — Encounter (HOSPITAL_COMMUNITY)
Admission: RE | Disposition: A | Discharge status: Home / Self Care | Payer: Self-pay | Source: Ambulatory Visit | Attending: Internal Medicine

## 2021-07-26 DIAGNOSIS — Z79899 Other long term (current) drug therapy: Secondary | ICD-10-CM | POA: Insufficient documentation

## 2021-07-26 DIAGNOSIS — Z1211 Encounter for screening for malignant neoplasm of colon: Secondary | ICD-10-CM | POA: Insufficient documentation

## 2021-07-26 DIAGNOSIS — I10 Essential (primary) hypertension: Secondary | ICD-10-CM | POA: Insufficient documentation

## 2021-07-26 DIAGNOSIS — K648 Other hemorrhoids: Secondary | ICD-10-CM | POA: Insufficient documentation

## 2021-07-26 HISTORY — PX: COLONOSCOPY WITH PROPOFOL: SHX5780

## 2021-07-26 SURGERY — COLONOSCOPY WITH PROPOFOL
Anesthesia: General

## 2021-07-26 MED ORDER — PROPOFOL 500 MG/50ML IV EMUL
INTRAVENOUS | Status: DC | PRN
  Administered 2021-07-26: 150 ug/kg/min via INTRAVENOUS

## 2021-07-26 MED ORDER — STERILE WATER FOR IRRIGATION IR SOLN
Status: DC | PRN
  Administered 2021-07-26: 120 mL

## 2021-07-26 MED ORDER — PROPOFOL 10 MG/ML IV BOLUS
INTRAVENOUS | Status: DC | PRN
  Administered 2021-07-26: 100 mg via INTRAVENOUS

## 2021-07-26 MED ORDER — LACTATED RINGERS IV SOLN
INTRAVENOUS | Status: DC
  Administered 2021-07-26: 13:00:00 1000 mL via INTRAVENOUS

## 2021-07-26 NOTE — Discharge Instructions (Addendum)
°  Colonoscopy °Discharge Instructions ° °Read the instructions outlined below and refer to this sheet in the next few weeks. These discharge instructions provide you with general information on caring for yourself after you leave the hospital. Your doctor may also give you specific instructions. While your treatment has been planned according to the most current medical practices available, unavoidable complications occasionally occur.  ° °ACTIVITY °You may resume your regular activity, but move at a slower pace for the next 24 hours.  °Take frequent rest periods for the next 24 hours.  °Walking will help get rid of the air and reduce the bloated feeling in your belly (abdomen).  °No driving for 24 hours (because of the medicine (anesthesia) used during the test).   °Do not sign any important legal documents or operate any machinery for 24 hours (because of the anesthesia used during the test).  °NUTRITION °Drink plenty of fluids.  °You may resume your normal diet as instructed by your doctor.  °Begin with a light meal and progress to your normal diet. Heavy or fried foods are harder to digest and may make you feel sick to your stomach (nauseated).  °Avoid alcoholic beverages for 24 hours or as instructed.  °MEDICATIONS °You may resume your normal medications unless your doctor tells you otherwise.  °WHAT YOU CAN EXPECT TODAY °Some feelings of bloating in the abdomen.  °Passage of more gas than usual.  °Spotting of blood in your stool or on the toilet paper.  °IF YOU HAD POLYPS REMOVED DURING THE COLONOSCOPY: °No aspirin products for 7 days or as instructed.  °No alcohol for 7 days or as instructed.  °Eat a soft diet for the next 24 hours.  °FINDING OUT THE RESULTS OF YOUR TEST °Not all test results are available during your visit. If your test results are not back during the visit, make an appointment with your caregiver to find out the results. Do not assume everything is normal if you have not heard from your  caregiver or the medical facility. It is important for you to follow up on all of your test results.  °SEEK IMMEDIATE MEDICAL ATTENTION IF: °You have more than a spotting of blood in your stool.  °Your belly is swollen (abdominal distention).  °You are nauseated or vomiting.  °You have a temperature over 101.  °You have abdominal pain or discomfort that is severe or gets worse throughout the day.  ° °Your colonoscopy was relatively unremarkable.  I did not find any polyps or evidence of colon cancer.  I recommend repeating colonoscopy in 10 years for colon cancer screening purposes.  Otherwise Follow-up with GI as needed. ° ° °I hope you have a great rest of your week! ° °Charles K. Carver, D.O. °Gastroenterology and Hepatology °[facility] ° °

## 2021-07-26 NOTE — Op Note (Signed)
[facility] Patient Name: Sara Harding Procedure Date: 07/26/2021 1:07 PM MRN: [id number] Date of Birth: 1970/10/12 Attending MD: Elon Alas. Abbey Chatters DO CSN: [id number] Age: 50 Admit Type: Outpatient Procedure:                Colonoscopy Indications:              Screening for colorectal malignant neoplasm Providers:                Elon Alas. Abbey Chatters, DO, Janeece Riggers, RN, Aram Candela Referring MD:              Medicines:                See the Anesthesia note for documentation of the                            administered medications Complications:            No immediate complications. Estimated Blood Loss:     Estimated blood loss: none. Procedure:                Pre-Anesthesia Assessment:                           - The anesthesia plan was to use monitored                            anesthesia care (MAC).                           After obtaining informed consent, the colonoscope                            was passed under direct vision. Throughout the                            procedure, the patient's blood pressure, pulse, and                            oxygen saturations were monitored continuously. The                            [id number]-HQ190L ([id number]) scope was introduced through                            the anus and advanced to the the cecum, identified                            by appendiceal orifice and ileocecal valve. The                            colonoscopy was performed without difficulty. The                            patient tolerated the procedure well. The quality                            of  the bowel preparation was evaluated using the                            BBPS [facility] Bowel Preparation Scale) with scores                            of: Right Colon = 3, Transverse Colon = 3 and Left                            Colon = 3 (entire mucosa seen well with no residual                            staining, small fragments of stool or opaque                             liquid). The total BBPS score equals 9. Scope In: 1:21:08 PM Scope Out: 1:33:27 PM Scope Withdrawal Time: 0 hours 9 minutes 38 seconds  Total Procedure Duration: 0 hours 12 minutes 19 seconds  Findings:      The perianal and digital rectal examinations were normal.      Non-bleeding internal hemorrhoids were found during endoscopy.      The exam was otherwise without abnormality. Impression:               - Non-bleeding internal hemorrhoids.                           - The examination was otherwise normal.                           - No specimens collected. Moderate Sedation:      Per Anesthesia Care Recommendation:           - Patient has a contact number available for                            emergencies. The signs and symptoms of potential                            delayed complications were discussed with the                            patient. Return to normal activities tomorrow.                            Written discharge instructions were provided to the                            patient.                           - Resume previous diet.                           - Continue present medications.                           -  Repeat colonoscopy in 10 years for screening                            purposes.                           - Return to GI clinic PRN. Procedure Code(s):        --- Professional ---                           B0962, Colorectal cancer screening; colonoscopy on                            individual not meeting criteria for high risk Diagnosis Code(s):        --- Professional ---                           Z12.11, Encounter for screening for malignant                            neoplasm of colon                           K64.8, Other hemorrhoids CPT copyright 2019 American Medical Association. All rights reserved. The codes documented in this report are preliminary and upon coder review may  be revised to meet current compliance  requirements. Elon Alas. Abbey Chatters, DO [place] Abbey Chatters, DO 07/26/2021 1:35:53 PM This report has been signed electronically. Number of Addenda: 0

## 2021-07-26 NOTE — Transfer of Care (Signed)
Immediate Anesthesia Transfer of Care Note  Patient: Sara Harding  Procedure(s) Performed: COLONOSCOPY WITH PROPOFOL  Patient Location: Endoscopy Unit  Anesthesia Type:General  Level of Consciousness: awake  Airway & Oxygen Therapy: Patient Spontanous Breathing  Post-op Assessment: Report given to RN and Post -op Vital signs reviewed and stable  Post vital signs: Reviewed and stable  Last Vitals:  Vitals Value Taken Time  BP    Temp    Pulse    Resp    SpO2      Last Pain:  Vitals:   07/26/21 1318  TempSrc:   PainSc: 0-No pain      Patients Stated Pain Goal: 9 (07/26/21 1245)  Complications: No notable events documented.

## 2021-07-26 NOTE — Anesthesia Preprocedure Evaluation (Addendum)
Anesthesia Evaluation  Patient identified by MRN, date of birth, ID band Patient awake    Reviewed: Allergy & Precautions, NPO status , Patient's Chart, lab work & pertinent test results  Airway Mallampati: II  TM Distance: >3 FB Neck ROM: Full    Dental  (+) Dental Advisory Given, Teeth Intact   Pulmonary neg pulmonary ROS,    Pulmonary exam normal breath sounds clear to auscultation       Cardiovascular Exercise Tolerance: Good hypertension, Pt. on medications Normal cardiovascular exam Rhythm:Regular Rate:Normal     Neuro/Psych negative neurological ROS  negative psych ROS   GI/Hepatic negative GI ROS, Neg liver ROS,   Endo/Other  negative endocrine ROS  Renal/GU negative Renal ROS  negative genitourinary   Musculoskeletal negative musculoskeletal ROS (+)   Abdominal   Peds negative pediatric ROS (+)  Hematology negative hematology ROS (+)   Anesthesia Other Findings   Reproductive/Obstetrics negative OB ROS                            Anesthesia Physical Anesthesia Plan  ASA: 2  Anesthesia Plan: General   Post-op Pain Management: Minimal or no pain anticipated   Induction:   PONV Risk Score and Plan: Treatment may vary due to age or medical condition  Airway Management Planned: Nasal Cannula and Natural Airway  Additional Equipment:   Intra-op Plan:   Post-operative Plan:   Informed Consent: I have reviewed the patients History and Physical, chart, labs and discussed the procedure including the risks, benefits and alternatives for the proposed anesthesia with the patient or authorized representative who has indicated his/her understanding and acceptance.     Dental advisory given  Plan Discussed with: CRNA and Surgeon  Anesthesia Plan Comments:         Anesthesia Quick Evaluation

## 2021-07-26 NOTE — H&P (Signed)
Primary Care Physician:  Kara Pacer, NP Primary Gastroenterologist:  Dr. Marletta Lor  Pre-Procedure History & Physical: HPI:  Sara Harding is a 50 y.o. female is here for a colonoscopy for colon cancer screening purposes.  Patient denies any family history of colorectal cancer.  No melena or hematochezia.  No abdominal pain or unintentional weight loss.  No change in bowel habits.  Overall feels well from a GI standpoint.  Past Medical History:  Diagnosis Date   Chronic rhinitis    Fungal dermatitis    Hypertension    Obesity     Past Surgical History:  Procedure Laterality Date   MOLE REMOVAL Right 1989   Behind ear   PLANTAR'S WART EXCISION Left 1986   TONSILLECTOMY  1973    Prior to Admission medications   Medication Sig Start Date End Date Taking? Authorizing Provider  cetirizine (ZYRTEC) 10 MG tablet Take 10 mg by mouth daily as needed for allergies.   Yes [provider]  cholecalciferol (VITAMIN D) 25 MCG (1000 UNIT) tablet Take 1,000 Units by mouth daily.   Yes [provider]  lactase (LACTAID) 3000 units tablet Take 3,000 Units by mouth daily as needed (milk products).   Yes [provider]  lisinopril (ZESTRIL) 10 MG tablet Take 10 mg by mouth daily. 05/17/21  Yes [provider]  Multiple Vitamin (MULTIVITAMIN) tablet Take 1 tablet by mouth daily.   Yes [provider]  Omega-3 1000 MG CAPS Take 1,000 mg by mouth daily.   Yes [provider]    Allergies as of 07/07/2021 - Review Complete 07/05/2021  Allergen Reaction Noted   Diphenhydramine hcl      Family History  Problem Relation Age of Onset   Bone cancer Maternal Grandmother    Emphysema Maternal Grandmother    Rheumatic fever Maternal Grandfather    Heart disease Father    Diabetes Mother    Hypertension Mother    Heart disease Mother    Arthritis Mother    Hyperlipidemia Mother    Kidney disease Mother    Ulcers Mother    Sleep  apnea Mother    Anemia Mother     Social History   Socioeconomic History   Marital status: Married    Spouse name: Not on file   Number of children: Not on file   Years of education: Not on file   Highest education level: Not on file  Occupational History   Not on file  Tobacco Use   Smoking status: Never   Smokeless tobacco: Never  Vaping Use   Vaping Use: Never used  Substance and Sexual Activity   Alcohol use: Yes    Alcohol/week: 0.0 standard drinks    Comment: socially   Drug use: No   Sexual activity: Not Currently    Birth control/protection: Post-menopausal  Other Topics Concern   Not on file  Social History Narrative   Not on file   Social Determinants of Health   Financial Resource Strain: Low Risk    Difficulty of Paying Living Expenses: Not very hard  Food Insecurity: No Food Insecurity   Worried About Running Out of Food in the Last Year: Never true   Ran Out of Food in the Last Year: Never true  Transportation Needs: No Transportation Needs   Lack of Transportation (Medical): No   Lack of Transportation (Non-Medical): No  Physical Activity: Insufficiently Active   Days of Exercise per Week: 3 days   Minutes of  Exercise per Session: 40 min  Stress: No Stress Concern Present   Feeling of Stress : Only a little  Social Connections: Unknown   Frequency of Communication with Friends and Family: Once a week   Frequency of Social Gatherings with Friends and Family: Once a week   Attends Religious Services: Patient refused   Database administrator or Organizations: Yes   Attends Engineer, structural: More than 4 times per year   Marital Status: Married  Catering manager Violence: Not At Risk   Fear of Current or Ex-Partner: No   Emotionally Abused: No   Physically Abused: No   Sexually Abused: No    Review of Systems: See HPI, otherwise negative ROS  Physical Exam: Vital signs in last 24 hours: Temp:  [98.9 F (37.2 C)] 98.9 F (37.2  C) (12/20 1245) Pulse Rate:  [64] 64 (12/20 1245) Resp:  [17] 17 (12/20 1245) BP: (140)/(87) 140/87 (12/20 1245) SpO2:  [100 %] 100 % (12/20 1245) Weight:  [95.3 kg] 95.3 kg (12/20 1245)   General:   Alert,  Well-developed, well-nourished, pleasant and cooperative in NAD Head:  Normocephalic and atraumatic. Eyes:  Sclera clear, no icterus.   Conjunctiva pink. Ears:  Normal auditory acuity. Nose:  No deformity, discharge,  or lesions. Mouth:  No deformity or lesions, dentition normal. Neck:  Supple; no masses or thyromegaly. Lungs:  Clear throughout to auscultation.   No wheezes, crackles, or rhonchi. No acute distress. Heart:  Regular rate and rhythm; no murmurs, clicks, rubs,  or gallops. Abdomen:  Soft, nontender and nondistended. No masses, hepatosplenomegaly or hernias noted. Normal bowel sounds, without guarding, and without rebound.   Msk:  Symmetrical without gross deformities. Normal posture. Extremities:  Without clubbing or edema. Neurologic:  Alert and  oriented x4;  grossly normal neurologically. Skin:  Intact without significant lesions or rashes. Cervical Nodes:  No significant cervical adenopathy. Psych:  Alert and cooperative. Normal mood and affect.  Impression/Plan: Sara Harding is here for a colonoscopy to be performed for colon cancer screening purposes.  The risks of the procedure including infection, bleed, or perforation as well as benefits, limitations, alternatives and imponderables have been reviewed with the patient. Questions have been answered. All parties agreeable.

## 2021-07-26 NOTE — Anesthesia Postprocedure Evaluation (Signed)
Anesthesia Post Note  Patient: Sara Harding  Procedure(s) Performed: COLONOSCOPY WITH PROPOFOL  Patient location during evaluation: Endoscopy Anesthesia Type: General Level of consciousness: awake and alert and oriented Pain management: pain level controlled Vital Signs Assessment: post-procedure vital signs reviewed and stable Respiratory status: spontaneous breathing, nonlabored ventilation and respiratory function stable Cardiovascular status: blood pressure returned to baseline and stable Postop Assessment: no apparent nausea or vomiting Anesthetic complications: no   No notable events documented.   Last Vitals:  Vitals:   07/26/21 1245 07/26/21 1335  BP: 140/87 114/87  Pulse: 64   Resp: 17 16  Temp: 37.2 C 36.8 C  SpO2: 100% 99%    Last Pain:  Vitals:   07/26/21 1335  TempSrc: Oral  PainSc: 0-No pain                 Lameisha Schuenemann C Landyn Lorincz

## 2021-07-28 ENCOUNTER — Encounter (HOSPITAL_COMMUNITY): Payer: Self-pay | Admitting: Internal Medicine

## 2022-03-08 ENCOUNTER — Ambulatory Visit: Payer: BC Managed Care – PPO | Admitting: Allergy & Immunology

## 2022-03-08 ENCOUNTER — Encounter: Payer: Self-pay | Admitting: Allergy & Immunology

## 2022-03-08 VITALS — BP 142/88 | HR 95 | Temp 98.3°F | Resp 16 | Ht 63.62 in | Wt 221.8 lb

## 2022-03-08 DIAGNOSIS — J3089 Other allergic rhinitis: Secondary | ICD-10-CM | POA: Diagnosis not present

## 2022-03-08 DIAGNOSIS — J4530 Mild persistent asthma, uncomplicated: Secondary | ICD-10-CM

## 2022-03-08 NOTE — Progress Notes (Signed)
NEW PATIENT  Date of Service/Encounter:  03/08/22  Consult requested by: Kara Pacer, NP   Assessment:   Mild persistent asthma, uncomplicated  Seasonal and perennial allergic rhinitis (rasses, ragweed, weeds, trees, indoor molds, outdoor molds, dust mites, cat, dog, cockroach, and mixed feathers, mouse, tobacco)  Plan/Recommendations:   1. Mild persistent asthma, uncomplicated - Lung testing was slightly lower, but it did improve with the albuterol puffs. - This points towards a diagnosis of asthma, but does not necessarily rule it in. - We are going to start a medication called Singulair (montelukast), which can help with especially allergic asthma. - If this not work, we might have to start a daily inhaler. - Singulair can rarely cause irritability and bad dreams, so be aware of this and stop it if this happens. - Spacer use reviewed. - Daily controller medication(s): Singulair 10mg  daily - Prior to physical activity: albuterol 2 puffs 10-15 minutes before physical activity. - Rescue medications: albuterol 4 puffs every 4-6 hours as needed - Asthma control goals:  * Full participation in all desired activities (may need albuterol before activity) * Albuterol use two time or less a week on average (not counting use with activity) * Cough interfering with sleep two time or less a month * Oral steroids no more than once a year * No hospitalizations  - Spirometry with Graph  2. Seasonal and perennial allergic rhinitis - Testing today showed: grasses, ragweed, weeds, trees, indoor molds, outdoor molds, dust mites, cat, dog, cockroach, and mixed feathers, mouse, tobacco  - Copy of test results provided.  - Avoidance measures provided. - Stop taking: Zyrtec - Start taking: Xyzal (levocetirizine) 5mg  tablet TWICE daily and and Singulair (montelukast) 10mg  daily and Ryaltris (olopatadine/mometasone) two sprays per nostril 1-2 times daily as needed - You can use an  extra dose of the antihistamine, if needed, for breakthrough symptoms.  - Consider nasal saline rinses 1-2 times daily to remove allergens from the nasal cavities as well as help with mucous clearance (this is especially helpful to do before the nasal sprays are given) - Strongly consider allergy shots as a means of long-term control. - Allergy shots "re-train" and "reset" the immune system to ignore environmental allergens and decrease the resulting immune response to those allergens (sneezing, itchy watery eyes, runny nose, nasal congestion, etc).    - Allergy shots improve symptoms in 75-85% of patients.  - We can discuss more at the next appointment if the medications are not working for you.  3. Return in about 4 weeks (around 04/05/2022).   This note in its entirety was forwarded to the Provider who requested this consultation.  Subjective:   VERSA CRATON is a 51 y.o. female presenting today for evaluation of  Chief Complaint  Patient presents with   Cough    Says she has a cough that will not go away. 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   Allergic Rhinitis     Say she was a patient here many years ago and was told she had allergies and was given Zyrtec to treat her symptoms. 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 She moved here 16 years ago.   Ok EdwardsEllyn R Titzer was referred by Kara PacerNsumanganyi, [place] Cesar, NP.     Dario Guardianllyn is a 51 y.o. female presenting for an evaluation of asthma as well as a chronic cough and postnasal drip .   Asthma/Respiratory Symptom  History: She does have a history of PNA back in 2016. She was in the ED and she got an inhaler. This is the first time that she had an inhaler. She does have a spacer.  She used it only during the 2016 episode. This presented as a pain in her side. But in August 2022, her symptoms worsened. She reports that she tends to get a "little cough" initially and then it became a more sustained cough in October. It disappeared over holiday break but returned in January 2023. She has been taking cetirizine to help with this. 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 At one point, she thought that this was related to her environment there because it would tend to improve over the summer months. But this year it has continued.   Allergic Rhinitis Symptom History: She has been tested in the distant past. She has not used Flonase in quite some time. Most of the time, she has more of a dry cough.  She does not remember the results of the testing. She does not get antibiotics at all for her symptoms. She has rarely required them in the last 15 years. She does not use an antihistamine on a routine basis. She has never been on allergy shots at all.   Otherwise, there is no history of other atopic diseases, including food allergies, drug allergies, stinging insect allergies, eczema, urticaria, or contact dermatitis. There is no significant infectious history. Vaccinations are up to date.    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 If you have any questions, ask your nurse or doctor.          cetirizine 10 MG tablet Commonly known  as: ZYRTEC Take 10 mg by mouth daily as needed for allergies.   cholecalciferol 25 MCG (1000 UNIT) tablet Commonly known as: VITAMIN D3 Take 1,000 Units by mouth daily.   lactase 3000 units tablet Commonly known as: LACTAID Take 3,000 Units by mouth daily as needed (milk products).   lisinopril 10 MG tablet Commonly known as: ZESTRIL Take 10 mg by mouth daily.   multivitamin tablet Take 1 tablet by mouth daily.   Omega-3 1000 MG Caps Take 1,000 mg by mouth daily.        Birth History: non-contributory  Developmental History: non-contributory  Past Surgical History: Past Surgical History:  Procedure Laterality Date   COLONOSCOPY WITH PROPOFOL N/A 07/26/2021   Procedure: COLONOSCOPY WITH PROPOFOL;  Surgeon: Lanelle Balarver, Charles K, DO;  Location: [facility];  Service: Endoscopy;  Laterality: N/A;  1:45 / ASA II   MOLE REMOVAL Right 1989  Behind ear   PLANTAR'S WART EXCISION Left 1986   TONSILLECTOMY  1973     Family History: Family History  Problem Relation Age of Onset   Allergic rhinitis Mother    Diabetes Mother    Hypertension Mother    Heart disease Mother    Arthritis Mother    Hyperlipidemia Mother    Kidney disease Mother    Ulcers Mother    Sleep apnea Mother    Anemia Mother    COPD Mother    Heart disease Father    Allergic rhinitis Brother    Bone cancer Maternal Grandmother    Emphysema Maternal Grandmother    Rheumatic fever Maternal Grandfather      Social History: Biance lives at home with her family.  She lives in a house that is around 54 years old.  There is hardwood in the main living areas and carpeting in the bedroom.  She has electric heating with wood supplement.  She has central cooling with fans.  There are cats inside of the home and wildlife outside of the home.  She does have dust mite covers on the bedding.  There is no tobacco exposure.  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  She is not exposed to fumes, chemicals, or dust.  There is a HEPA filter in the home.  They do not live near an interstate or industrial area.     Review of Systems  Constitutional: Negative.  Negative for chills, fever, malaise/fatigue and weight loss.  HENT:  Positive for congestion. Negative for ear discharge, ear pain and sinus pain.   Eyes:  Negative for pain, discharge and redness.  Respiratory:  Positive for cough and shortness of breath. Negative for sputum production and wheezing.   Cardiovascular: Negative.  Negative for chest pain and palpitations.  Gastrointestinal:  Negative for abdominal pain, constipation, diarrhea, heartburn, nausea and vomiting.  Skin: Negative.  Negative for itching and rash.  Neurological:  Negative for dizziness and headaches.  Endo/Heme/Allergies:  Positive for environmental allergies. Does not bruise/bleed easily.       Objective:   Blood pressure (!) 142/88, pulse 95, temperature 98.3 F (36.8 C), temperature source Temporal, resp. rate 16, height 5' 3.62" (1.616 m), weight 221 lb 12.8 oz (100.6 kg), last menstrual period 02/04/2018, SpO2 98 %. Body mass index is 38.53 kg/m.     Physical Exam Vitals reviewed.  Constitutional:      Appearance: She is well-developed.  HENT:     Head: Normocephalic and atraumatic.     Right Ear: Tympanic membrane, ear canal and external ear normal. No drainage, swelling or tenderness. Tympanic membrane is not injected, scarred, erythematous, retracted or bulging.     Left Ear: Tympanic membrane, ear canal and external ear normal. No drainage, swelling or tenderness. Tympanic membrane is not injected, scarred, erythematous, retracted or bulging.     Nose: Mucosal edema and rhinorrhea present. No nasal deformity or septal deviation.     Right Turbinates: Enlarged, swollen and pale.     Left Turbinates: Enlarged, swollen and pale.     Right Sinus: No maxillary sinus tenderness or  frontal sinus tenderness.     Left Sinus: No maxillary sinus tenderness or frontal sinus tenderness.     Comments: No nasal polyps appreciated at all.     Mouth/Throat:     Mouth: Mucous membranes are not pale and not dry.  Pharynx: Uvula midline.  Eyes:     General:        Right eye: No discharge.        Left eye: No discharge.     Conjunctiva/sclera: Conjunctivae normal.     Right eye: Right conjunctiva is not injected. No chemosis.    Left eye: Left conjunctiva is not injected. No chemosis.    Pupils: Pupils are equal, round, and reactive to light.  Cardiovascular:     Rate and Rhythm: Normal rate and regular rhythm.     Heart sounds: Normal heart sounds.  Pulmonary:     Effort: Pulmonary effort is normal. No tachypnea, accessory muscle usage or respiratory distress.     Breath sounds: Normal breath sounds. No wheezing, rhonchi or rales.     Comments: Moving air well in all lung fields. No increased work of breathing.  Chest:     Chest wall: No tenderness.  Abdominal:     Tenderness: There is no abdominal tenderness. There is no guarding or rebound.  Lymphadenopathy:     Head:     Right side of head: No submandibular, tonsillar or occipital adenopathy.     Left side of head: No submandibular, tonsillar or occipital adenopathy.     Cervical: No cervical adenopathy.  Skin:    Coloration: Skin is not pale.     Findings: No abrasion, erythema, petechiae or rash. Rash is not papular, urticarial or vesicular.  Neurological:     Mental Status: She is alert.  Psychiatric:        Behavior: Behavior is cooperative.      Diagnostic studies:    Spirometry: results abnormal (FEV1: 1.95/75%, FVC: 2.36/72%, FEV1/FVC: 83%).    Spirometry consistent with possible restrictive disease. Albuterol four puffs via MDI treatment given in clinic with improvement in FEV1 and FVC, but not significant per ATS criteria.  Allergy Studies:     Airborne Adult Perc - 03/08/22 1522     Time  Antigen Placed 1522    Allergen Manufacturer Waynette Buttery    Location Back    Number of Test 59    1. Control-Buffer 50% Glycerol Negative    2. Control-Histamine 1 mg/ml 2+    3. Albumin saline Negative    4. Bahia 3+    5. [place] 2+    6. Johnson 2+    7. [place] Blue 2+    8. Meadow Fescue 2+    9. Perennial Rye 2+    10. Sweet Vernal 2+    11. Timothy 2+    12. Cocklebur 2+    13. Burweed Marshelder 2+    14. Ragweed, short 2+    15. Ragweed, Giant 2+    16. Plantain,  English Negative    17. Lamb's Quarters 2+    18. Sheep Sorrell 2+    19. Rough Pigweed 3+    20. Marsh Elder, Rough 3+    21. Mugwort, Common Negative    22. Ash mix Negative    23. Birch mix 2+    24. Beech American 2+    25. Box, Elder 2+    26. Cedar, red 2+    27. Cottonwood, Eastern 2+    28. Elm mix 2+    29. Hickory 2+    30. Maple mix 3+    31. Oak, Guinea[place] mix Negative    32. Pecan Pollen Negative    33. Pine mix 3+    34. Sycamore Eastern 3+  35. Walnut, Black Pollen 3+    36. Alternaria alternata 2+    37. Cladosporium Herbarum 3+    38. Aspergillus mix 3+    39. Penicillium mix 3+    40. Bipolaris sorokiniana (Helminthosporium) 3+    41. Drechslera spicifera (Curvularia) 2+    42. Mucor plumbeus 3+    43. Fusarium moniliforme 3+    44. Aureobasidium pullulans (pullulara) 3+    45. Rhizopus oryzae 3+    46. Botrytis cinera Negative    47. Epicoccum nigrum Negative    48. Phoma betae Negative    49. Candida Albicans 2+    50. Trichophyton mentagrophytes 3+    51. Mite, D Farinae  5,000 AU/ml Negative    52. Mite, D Pteronyssinus  5,000 AU/ml 3+    53. Cat Hair 10,000 BAU/ml 3+    54.  Dog Epithelia 3+    55. Mixed Feathers 3+    56. Horse Epithelia Negative    57. Cockroach, German 3+    58. Mouse 3+    59. Tobacco Leaf 3+             Allergy testing results were read and interpreted by myself, documented by clinical staff.         Malachi Bonds, MD [facility]

## 2022-03-08 NOTE — Patient Instructions (Addendum)
1. Mild persistent asthma, uncomplicated - Lung testing was slightly lower, but it did improve with the albuterol puffs. - This points towards a diagnosis of asthma, but does not necessarily rule it in. - We are going to start a medication called Singulair (montelukast), which can help with especially allergic asthma. - If this not work, we might have to start a daily inhaler. - Singulair can rarely cause irritability and bad dreams, so be aware of this and stop it if this happens. - Spacer use reviewed. - Daily controller medication(s): Singulair 10mg  daily - Prior to physical activity: albuterol 2 puffs 10-15 minutes before physical activity. - Rescue medications: albuterol 4 puffs every 4-6 hours as needed - Asthma control goals:  * Full participation in all desired activities (may need albuterol before activity) * Albuterol use two time or less a week on average (not counting use with activity) * Cough interfering with sleep two time or less a month * Oral steroids no more than once a year * No hospitalizations  - Spirometry with Graph  2. Seasonal and perennial allergic rhinitis - Testing today showed: grasses, ragweed, weeds, trees, indoor molds, outdoor molds, dust mites, cat, dog, cockroach, and mixed feathers, mouse, tobacco . - Copy of test results provided.  - Avoidance measures provided. - Stop taking: Zyrtec - Start taking: Xyzal (levocetirizine) 5mg  tablet TWICE daily and and Singulair (montelukast) 10mg  daily and Ryaltris (olopatadine/mometasone) two sprays per nostril 1-2 times daily as needed - You can use an extra dose of the antihistamine, if needed, for breakthrough symptoms.  - Consider nasal saline rinses 1-2 times daily to remove allergens from the nasal cavities as well as help with mucous clearance (this is especially helpful to do before the nasal sprays are given) - Strongly consider allergy shots as a means of long-term control. - Allergy shots "re-train" and  "reset" the immune system to ignore environmental allergens and decrease the resulting immune response to those allergens (sneezing, itchy watery eyes, runny nose, nasal congestion, etc).    - Allergy shots improve symptoms in 75-85% of patients.  - We can discuss more at the next appointment if the medications are not working for you.  3. Return in about 4 weeks (around 04/05/2022).    Please inform of any Emergency Department visits, hospitalizations, or changes in symptoms. Call [phone] before going to the ED for breathing or allergy symptoms since we might be able to fit you in for a sick visit. Feel free to contact 04/07/2022 anytime with any questions, problems, or concerns.  It was a pleasure to meet you today!  Websites that have reliable patient information: 1. American Academy of Asthma, Allergy, and Immunology: www.aaaai.org 2. Food Allergy Research and Education (FARE): foodallergy.org 3. Mothers of Asthmatics: http://www.asthmacommunitynetwork.org 4. American College of Allergy, Asthma, and Immunology: www.acaai.org   COVID-19 Vaccine Information can be found at: [place] For questions related to vaccine distribution or appointments, please email [email or web address] or call [phone].   We realize that you might be concerned about having an allergic reaction to the COVID19 vaccines. To help with that concern, WE ARE OFFERING THE COVID19 VACCINES IN OUR OFFICE! Ask the front desk for dates!     "Like" [place] on Facebook and Instagram for our latest updates!      A healthy democracy works best when [email or web address] participate! Make sure you are registered to vote! If you have moved or changed any of your contact information, you will need to get this updated  before voting!  In some cases, you MAY be able to register to vote online: [email or web address]      Airborne Adult Perc - 03/08/22  1522     Time Antigen Placed 1522    Allergen Manufacturer Waynette Buttery    Location Back    Number of Test 59    1. Control-Buffer 50% Glycerol Negative    2. Control-Histamine 1 mg/ml 2+    3. Albumin saline Negative    4. [place] 3+    5. [place] 2+    6. Johnson 2+    7. [place] Blue 2+    8. Meadow Fescue 2+    9. Perennial Rye 2+    10. Sweet Vernal 2+    11. Timothy 2+    12. Cocklebur 2+    13. Burweed Marshelder 2+    14. Ragweed, short 2+    15. Ragweed, Giant 2+    16. Plantain,  English Negative    17. Lamb's Quarters 2+    18. Sheep Sorrell 2+    19. Rough Pigweed 3+    20. Marsh Elder, Rough 3+    21. Mugwort, Common Negative    22. Ash mix Negative    23. Birch mix 2+    24. Beech American 2+    25. Box, Elder 2+    26. Cedar, red 2+    27. Cottonwood, Eastern 2+    28. Elm mix 2+    29. Hickory 2+    30. Maple mix 3+    31. Oak, [place] mix Negative    32. Pecan Pollen Negative    33. Pine mix 3+    34. Sycamore Eastern 3+    35. Walnut, Black Pollen 3+    36. Alternaria alternata 2+    37. Cladosporium Herbarum 3+    38. Aspergillus mix 3+    39. Penicillium mix 3+    40. Bipolaris sorokiniana (Helminthosporium) 3+    41. Drechslera spicifera (Curvularia) 2+    42. Mucor plumbeus 3+    43. Fusarium moniliforme 3+    44. Aureobasidium pullulans (pullulara) 3+    45. Rhizopus oryzae 3+    46. Botrytis cinera Negative    47. Epicoccum nigrum Negative    48. Phoma betae Negative    49. Candida Albicans 2+    50. Trichophyton mentagrophytes 3+    51. Mite, D Farinae  5,000 AU/ml Negative    52. Mite, D Pteronyssinus  5,000 AU/ml 3+    53. Cat Hair 10,000 BAU/ml 3+    54.  Dog Epithelia 3+    55. Mixed Feathers 3+    56. Horse Epithelia Negative    57. Cockroach, German 3+    58. Mouse 3+    59. Tobacco Leaf 3+             Reducing Pollen Exposure  The American Academy of Allergy, Asthma and Immunology suggests the following steps to reduce  your exposure to pollen during allergy seasons.    Do not hang sheets or clothing out to dry; pollen may collect on these items. Do not mow lawns or spend time around freshly cut grass; mowing stirs up pollen. Keep windows closed at night.  Keep car windows closed while driving. Minimize morning activities outdoors, a time when pollen counts are usually at their highest. Stay indoors as much as possible when pollen counts or humidity is high and on windy days when pollen tends to remain in  the air longer. Use air conditioning when possible.  Many air conditioners have filters that trap the pollen spores. Use a HEPA room air filter to remove pollen form the indoor air you breathe.  Control of Mold Allergen   Mold and fungi can grow on a variety of surfaces provided certain temperature and moisture conditions exist.  Outdoor molds grow on plants, decaying vegetation and soil.  The major outdoor mold, Alternaria and Cladosporium, are found in very high numbers during hot and dry conditions.  Generally, a late Summer - Fall peak is seen for common outdoor fungal spores.  Rain will temporarily lower outdoor mold spore count, but counts rise rapidly when the rainy period ends.  The most important indoor molds are Aspergillus and Penicillium.  Dark, humid and poorly ventilated basements are ideal sites for mold growth.  The next most common sites of mold growth are the bathroom and the kitchen.  Outdoor (Seasonal) Mold Control  Positive outdoor molds via skin testing: Alternaria, Cladosporium, Bipolaris (Helminthsporium), Drechslera (Curvalaria), and Mucor  Use air conditioning and keep windows closed Avoid exposure to decaying vegetation. Avoid leaf raking. Avoid grain handling. Consider wearing a face mask if working in moldy areas.    Indoor (Perennial) Mold Control   Positive indoor molds via skin testing: Aspergillus, Penicillium, Fusarium, Aureobasidium (Pullulara), Rhizopus, Candida, and  Trichophyton  Maintain humidity below 50%. Clean washable surfaces with 5% bleach solution. Remove sources e.g. contaminated carpets.    Control of Dust Mite Allergen    Dust mites play a major role in allergic asthma and rhinitis.  They occur in environments with high humidity wherever human skin is found.  Dust mites absorb humidity from the atmosphere (ie, they do not drink) and feed on organic matter (including shed human and animal skin).  Dust mites are a microscopic type of insect that you cannot see with the naked eye.  High levels of dust mites have been detected from mattresses, pillows, carpets, upholstered furniture, bed covers, clothes, soft toys and any woven material.  The principal allergen of the dust mite is found in its feces.  A gram of dust may contain 1,000 mites and 250,000 fecal particles.  Mite antigen is easily measured in the air during house cleaning activities.  Dust mites do not bite and do not cause harm to humans, other than by triggering allergies/asthma.    Ways to decrease your exposure to dust mites in your home:  Encase mattresses, box springs and pillows with a mite-impermeable barrier or cover   Wash sheets, blankets and drapes weekly in hot water (130 F) with detergent and dry them in a dryer on the hot setting.  Have the room cleaned frequently with a vacuum cleaner and a damp dust-mop.  For carpeting or rugs, vacuuming with a vacuum cleaner equipped with a high-efficiency particulate air (HEPA) filter.  The dust mite allergic individual should not be in a room which is being cleaned and should wait 1 hour after cleaning before going into the room. Do not sleep on upholstered furniture (eg, couches).   If possible removing carpeting, upholstered furniture and drapery from the home is ideal.  Horizontal blinds should be eliminated in the rooms where the person spends the most time (bedroom, study, television room).  Washable vinyl, roller-type shades are  optimal. Remove all non-washable stuffed toys from the bedroom.  Wash stuffed toys weekly like sheets and blankets above.   Reduce indoor humidity to less than 50%.  Inexpensive humidity monitors  can be purchased at most hardware stores.  Do not use a humidifier as can make the problem worse and are not recommended.  Control of Cockroach Allergen  Cockroach allergen has been identified as an important cause of acute attacks of asthma, especially in urban settings.  There are fifty-five species of cockroach that exist in the [place], however only three, the [place], [place] species produce allergen that can affect patients with Asthma.  Allergens can be obtained from fecal particles, egg casings and secretions from cockroaches.    Remove food sources. Reduce access to water. Seal access and entry points. Spray runways with 0.5-1% Diazinon or Chlorpyrifos Blow boric acid power under stoves and refrigerator. Place bait stations (hydramethylnon) at feeding sites.  Control of Dog or Cat Allergen  Avoidance is the best way to manage a dog or cat allergy. If you have a dog or cat and are allergic to dog or cats, consider removing the dog or cat from the home. If you have a dog or cat but don't want to find it a new home, or if your family wants a pet even though someone in the household is allergic, here are some strategies that may help keep symptoms at bay:  Keep the pet out of your bedroom and restrict it to only a few rooms. Be advised that keeping the dog or cat in only one room will not limit the allergens to that room. Don't pet, hug or kiss the dog or cat; if you do, wash your hands with soap and water. High-efficiency particulate air (HEPA) cleaners run continuously in a bedroom or living room can reduce allergen levels over time. Regular use of a high-efficiency vacuum cleaner or a central vacuum can reduce allergen levels. Giving your dog or cat a bath at least once a  week can reduce airborne allergen. Allergy Shots   Allergies are the result of a chain reaction that starts in the immune system. Your immune system controls how your body defends itself. For instance, if you have an allergy to pollen, your immune system identifies pollen as an invader or allergen. Your immune system overreacts by producing antibodies called Immunoglobulin E (IgE). These antibodies travel to cells that release chemicals, causing an allergic reaction.  The concept behind allergy immunotherapy, whether it is received in the form of shots or tablets, is that the immune system can be desensitized to specific allergens that trigger allergy symptoms. Although it requires time and patience, the payback can be long-term relief.  How Do Allergy Shots Work?  Allergy shots work much like a vaccine. Your body responds to injected amounts of a particular allergen given in increasing doses, eventually developing a resistance and tolerance to it. Allergy shots can lead to decreased, minimal or no allergy symptoms.  There generally are two phases: build-up and maintenance. Build-up often ranges from three to six months and involves receiving injections with increasing amounts of the allergens. The shots are typically given once or twice a week, though more rapid build-up schedules are sometimes used.  The maintenance phase begins when the most effective dose is reached. This dose is different for each person, depending on how allergic you are and your response to the build-up injections. Once the maintenance dose is reached, there are longer periods between injections, typically two to four weeks.  Occasionally doctors give cortisone-type shots that can temporarily reduce allergy symptoms. These types of shots are different and should not be confused with allergy immunotherapy shots.  Who Can Be Treated with Allergy Shots?  Allergy shots may be a good treatment approach for people with allergic  rhinitis (hay fever), allergic asthma, conjunctivitis (eye allergy) or stinging insect allergy.   Before deciding to begin allergy shots, you should consider:   The length of allergy season and the severity of your symptoms  Whether medications and/or changes to your environment can control your symptoms  Your desire to avoid long-term medication use  Time: allergy immunotherapy requires a major time commitment  Cost: may vary depending on your insurance coverage  Allergy shots for children age 11five and older are effective and often well tolerated. They might prevent the onset of new allergen sensitivities or the progression to asthma.  Allergy shots are not started on patients who are pregnant but can be continued on patients who become pregnant while receiving them. In some patients with other medical conditions or who take certain common medications, allergy shots may be of risk. It is important to mention other medications you talk to your allergist.   When Will I Feel Better?  Some may experience decreased allergy symptoms during the build-up phase. For others, it may take as long as 12 months on the maintenance dose. If there is no improvement after a year of maintenance, your allergist will discuss other treatment options with you.  If you aren't responding to allergy shots, it may be because there is not enough dose of the allergen in your vaccine or there are missing allergens that were not identified during your allergy testing. Other reasons could be that there are high levels of the allergen in your environment or major exposure to non-allergic triggers like tobacco smoke.  What Is the Length of Treatment?  Once the maintenance dose is reached, allergy shots are generally continued for three to five years. The decision to stop should be discussed with your allergist at that time. Some people may experience a permanent reduction of allergy symptoms. Others may relapse and a longer  course of allergy shots can be considered.  What Are the Possible Reactions?  The two types of adverse reactions that can occur with allergy shots are local and systemic. Common local reactions include very mild redness and swelling at the injection site, which can happen immediately or several hours after. A systemic reaction, which is less common, affects the entire body or a particular body system. They are usually mild and typically respond quickly to medications. Signs include increased allergy symptoms such as sneezing, a stuffy nose or hives.  Rarely, a serious systemic reaction called anaphylaxis can develop. Symptoms include swelling in the throat, wheezing, a feeling of tightness in the chest, nausea or dizziness. Most serious systemic reactions develop within 30 minutes of allergy shots. This is why it is strongly recommended you wait in your doctor's office for 30 minutes after your injections. Your allergist is trained to watch for reactions, and his or her staff is trained and equipped with the proper medications to identify and treat them.  Who Should Administer Allergy Shots?  The preferred location for receiving shots is your prescribing allergist's office. Injections can sometimes be given at another facility where the physician and staff are trained to recognize and treat reactions, and have received instructions by your prescribing allergist.

## 2022-03-22 ENCOUNTER — Telehealth: Payer: Self-pay

## 2022-03-22 MED ORDER — XYZAL ALLERGY 24HR 5 MG PO TABS: 5.0000 mg | ORAL_TABLET | Freq: Two times a day (BID) | ORAL | 5 refills | Status: DC

## 2022-03-22 MED ORDER — MONTELUKAST SODIUM 10 MG PO TABS: 10.0000 mg | ORAL_TABLET | Freq: Every day | ORAL | 5 refills | Status: DC

## 2022-03-22 MED ORDER — RYALTRIS 665-25 MCG/ACT NA SUSP: NASAL | 1 refills | Status: DC

## 2022-03-22 NOTE — Telephone Encounter (Signed)
Patient came into [facility] office - DOB/Pharmacy verified - stated medications were never sent into [facility].  Electronically sent medications to [facility] and [facility].

## 2022-04-07 ENCOUNTER — Ambulatory Visit: Payer: BC Managed Care – PPO | Admitting: Family Medicine

## 2022-04-07 ENCOUNTER — Encounter: Payer: Self-pay | Admitting: Family Medicine

## 2022-04-07 VITALS — BP 126/72 | HR 87 | Temp 98.7°F | Resp 18

## 2022-04-07 DIAGNOSIS — J3089 Other allergic rhinitis: Secondary | ICD-10-CM | POA: Diagnosis not present

## 2022-04-07 DIAGNOSIS — J302 Other seasonal allergic rhinitis: Secondary | ICD-10-CM | POA: Insufficient documentation

## 2022-04-07 DIAGNOSIS — J4530 Mild persistent asthma, uncomplicated: Secondary | ICD-10-CM | POA: Diagnosis not present

## 2022-04-07 NOTE — Patient Instructions (Addendum)
Asthma Continue montelukast 10 mg once a day to prevent cough or wheeze Continue albuterol 2 puffs once every 4 hours as needed for cough or wheeze You may use albuterol 2 puffs 5 to 15 minutes before activity to decrease cough or wheeze  Allergic rhinitis Continue allergen avoidance measures directed toward pollen, mold, dust mite, pet, cockroach, feather, mouse, and tobacco as listed below Continue Xyzal 5 mg once a day as needed for runny nose or itch.  You may take an additional dose of Xyzal 5 mg once a day as needed for breakthrough symptoms. Remember to rotate to a different antihistamine about every 3 months. Some examples of over the counter antihistamines include Zyrtec (cetirizine), Xyzal (levocetirizine), Allegra (fexofenadine), and Claritin (loratidine).  Continue Ryaltris 2 sprays in each nostril twice a day for nasal symptoms Consider saline nasal rinses as needed for nasal symptoms. Use this before any medicated nasal sprays for best result Consider allergen immunotherapy if your symptoms are not well controlled with the treatment plan as listed above  Call the clinic if this treatment plan is not working well for you.  Follow up in 6 months or sooner if needed.  Reducing Pollen Exposure The American Academy of Allergy, Asthma and Immunology suggests the following steps to reduce your exposure to pollen during allergy seasons. Do not hang sheets or clothing out to dry; pollen may collect on these items. Do not mow lawns or spend time around freshly cut grass; mowing stirs up pollen. Keep windows closed at night.  Keep car windows closed while driving. Minimize morning activities outdoors, a time when pollen counts are usually at their highest. Stay indoors as much as possible when pollen counts or humidity is high and on windy days when pollen tends to remain in the air longer. Use air conditioning when possible.  Many air conditioners have filters that trap the pollen  spores. Use a HEPA room air filter to remove pollen form the indoor air you breathe. Control of Mold Allergen Mold and fungi can grow on a variety of surfaces provided certain temperature and moisture conditions exist.  Outdoor molds grow on plants, decaying vegetation and soil.  The major outdoor mold, Alternaria and Cladosporium, are found in very high numbers during hot and dry conditions.  Generally, a late Summer - Fall peak is seen for common outdoor fungal spores.  Rain will temporarily lower outdoor mold spore count, but counts rise rapidly when the rainy period ends.  The most important indoor molds are Aspergillus and Penicillium.  Dark, humid and poorly ventilated basements are ideal sites for mold growth.  The next most common sites of mold growth are the bathroom and the kitchen.  Outdoor [employer] Use air conditioning and keep windows closed Avoid exposure to decaying vegetation. Avoid leaf raking. Avoid grain handling. Consider wearing a face mask if working in moldy areas.  Indoor Mold Control Maintain humidity below 50%. Clean washable surfaces with 5% bleach solution. Remove sources e.g. Contaminated carpets.   Control of Dust Mite Allergen Dust mites play a major role in allergic asthma and rhinitis. They occur in environments with high humidity wherever human skin is found. Dust mites absorb humidity from the atmosphere (ie, they do not drink) and feed on organic matter (including shed human and animal skin). Dust mites are a microscopic type of insect that you cannot see with the naked eye. High levels of dust mites have been detected from mattresses, pillows, carpets, upholstered furniture, bed covers, clothes, soft  toys and any woven material. The principal allergen of the dust mite is found in its feces. A gram of dust may contain 1,000 mites and 250,000 fecal particles. Mite antigen is easily measured in the air during house cleaning activities. Dust mites do not bite  and do not cause harm to humans, other than by triggering allergies/asthma.  Ways to decrease your exposure to dust mites in your home:  1. Encase mattresses, box springs and pillows with a mite-impermeable barrier or cover  2. Wash sheets, blankets and drapes weekly in hot water (130 F) with detergent and dry them in a dryer on the hot setting.  3. Have the room cleaned frequently with a vacuum cleaner and a damp dust-mop. For carpeting or rugs, vacuuming with a vacuum cleaner equipped with a high-efficiency particulate air (HEPA) filter. The dust mite allergic individual should not be in a room which is being cleaned and should wait 1 hour after cleaning before going into the room.  4. Do not sleep on upholstered furniture (eg, couches).  5. If possible removing carpeting, upholstered furniture and drapery from the home is ideal. Horizontal blinds should be eliminated in the rooms where the person spends the most time (bedroom, study, television room). Washable vinyl, roller-type shades are optimal.  6. Remove all non-washable stuffed toys from the bedroom. Wash stuffed toys weekly like sheets and blankets above.  7. Reduce indoor humidity to less than 50%. Inexpensive humidity monitors can be purchased at most hardware stores. Do not use a humidifier as can make the problem worse and are not recommended.  Control of Dog or Cat Allergen Avoidance is the best way to manage a dog or cat allergy. If you have a dog or cat and are allergic to dog or cats, consider removing the dog or cat from the home. If you have a dog or cat but don't want to find it a new home, or if your family wants a pet even though someone in the household is allergic, here are some strategies that may help keep symptoms at bay:  Keep the pet out of your bedroom and restrict it to only a few rooms. Be advised that keeping the dog or cat in only one room will not limit the allergens to that room. Don't pet, hug or kiss the  dog or cat; if you do, wash your hands with soap and water. High-efficiency particulate air (HEPA) cleaners run continuously in a bedroom or living room can reduce allergen levels over time. Regular use of a high-efficiency vacuum cleaner or a central vacuum can reduce allergen levels. Giving your dog or cat a bath at least once a week can reduce airborne allergen.  Control of Cockroach Allergen Cockroach allergen has been identified as an important cause of acute attacks of asthma, especially in urban settings.  There are fifty-five species of cockroach that exist in the [place], however only three, the [place], [place] species produce allergen that can affect patients with Asthma.  Allergens can be obtained from fecal particles, egg casings and secretions from cockroaches.    Remove food sources. Reduce access to water. Seal access and entry points. Spray runways with 0.5-1% Diazinon or Chlorpyrifos Blow boric acid power under stoves and refrigerator. Place bait stations (hydramethylnon) at feeding sites.

## 2022-04-07 NOTE — Progress Notes (Signed)
[name] [address] Dept: [phone]  FOLLOW UP NOTE  Patient ID: Sara Harding, female    DOB: 03/06/1971  Age: 51 y.o. MRN: [id number] Date of Office Visit: 04/07/2022  Assessment  Chief Complaint: Asthma and Cough (Cough is less meds are working. Notices that she does blow out phlegm )  HPI Sara Harding is a 51 year old female who presents to the clinic for follow-up visit.  She was last seen in this clinic on 03/08/2022 by Dr. Dellis Anes for new patient appointment to evaluate asthma and allergic rhinitis.  Asthma is reported as well controlled with no shortness of breath or wheeze. She reports that she continues to experience some cough which is producing clear mucus, however, the cough has decreased significantly since her last visit to this clinic. She continues montelukast 10 mg once a day and has not used her albuterol since her last visit to this clinic. She denies any adverse side effects while continuing on montelukast. Allergic rhinitis is reported as moderately well controlled with post nasal drainage as the main symptom. She continues Xyzal twice a day, montelukast daily, Ryaltrys 2 sprays in each nostril as needed and is using a nasal saline rinse. Her last environmental allergy skin testing was on 03/08/2022 and was positive to pollens, mold, dust mite, pets, cockroach, feather, mouse, and tobacco. She reports that she is satisfied with her allergy symptom control at this time and is not interested in moving toward allergy injections now. Her current medications are listed in the chart.    Drug Allergies:  Allergies  Allergen Reactions   Diphenhydramine Hcl Hives    Physical Exam: BP 126/72   Pulse 87   Temp 98.7 F (37.1 C)   Resp 18   LMP 02/04/2018   SpO2 97%    Physical Exam Vitals reviewed.  Constitutional:      Appearance: Normal appearance.  HENT:     Head: Normocephalic and atraumatic.     Right Ear: Tympanic membrane  normal.     Left Ear: Tympanic membrane normal.     Nose:     Comments: Bilateral nares slightly erythematous with clear nasal drainage noted. Pharynx normal. Ears normal. Eyes normal.    Mouth/Throat:     Pharynx: Oropharynx is clear.  Eyes:     Conjunctiva/sclera: Conjunctivae normal.  Cardiovascular:     Rate and Rhythm: Normal rate and regular rhythm.     Heart sounds: Normal heart sounds. No murmur heard. Pulmonary:     Effort: Pulmonary effort is normal.     Breath sounds: Normal breath sounds.     Comments: Lungs clear to auscultation Musculoskeletal:        General: Normal range of motion.     Cervical back: Normal range of motion and neck supple.  Skin:    General: Skin is warm and dry.  Neurological:     Mental Status: She is alert and oriented to person, place, and time.  Psychiatric:        Mood and Affect: Mood normal.        Behavior: Behavior normal.        Thought Content: Thought content normal.        Judgment: Judgment normal.     Diagnostics: FVC 2.24, FEV1 1.88.  Predicted FVC 3.26, predicted FEV1 2.60.  Spirometry indicates possible restriction.  This is consistent with previous spirometry readings.  Assessment and Plan: 1. Mild persistent asthma, uncomplicated   2. Seasonal  and perennial allergic rhinitis     Patient Instructions  Asthma Continue montelukast 10 mg once a day to prevent cough or wheeze Continue albuterol 2 puffs once every 4 hours as needed for cough or wheeze You may use albuterol 2 puffs 5 to 15 minutes before activity to decrease cough or wheeze  Allergic rhinitis Continue allergen avoidance measures directed toward pollen, mold, dust mite, pet, cockroach, feather, mouse, and tobacco as listed below Continue Xyzal 5 mg once a day as needed for runny nose or itch.  You may take an additional dose of Xyzal 5 mg once a day as needed for breakthrough symptoms. Remember to rotate to a different antihistamine about every 3 months. Some  examples of over the counter antihistamines include Zyrtec (cetirizine), Xyzal (levocetirizine), Allegra (fexofenadine), and Claritin (loratidine).  Continue Ryaltris 2 sprays in each nostril twice a day for nasal symptoms Consider saline nasal rinses as needed for nasal symptoms. Use this before any medicated nasal sprays for best result Consider allergen immunotherapy if your symptoms are not well controlled with the treatment plan as listed above  Call the clinic if this treatment plan is not working well for you.  Follow up in 6 months or sooner if needed.  Return in about 6 months (around 10/06/2022).    Thank you for the opportunity to care for this patient.  Please do not hesitate to contact me with questions.  Thermon Leyland, FNP [facility]

## 2022-10-18 ENCOUNTER — Encounter: Payer: Self-pay | Admitting: Allergy & Immunology

## 2022-10-18 ENCOUNTER — Ambulatory Visit: Payer: BC Managed Care – PPO | Admitting: Allergy & Immunology

## 2022-10-18 ENCOUNTER — Other Ambulatory Visit: Payer: Self-pay

## 2022-10-18 VITALS — BP 124/70 | HR 72 | Temp 98.2°F | Resp 20 | Ht 64.0 in | Wt 215.0 lb

## 2022-10-18 DIAGNOSIS — J4530 Mild persistent asthma, uncomplicated: Secondary | ICD-10-CM

## 2022-10-18 DIAGNOSIS — J3089 Other allergic rhinitis: Secondary | ICD-10-CM

## 2022-10-18 DIAGNOSIS — J302 Other seasonal allergic rhinitis: Secondary | ICD-10-CM

## 2022-10-18 MED ORDER — XYZAL ALLERGY 24HR 5 MG PO TABS: 5.0000 mg | ORAL_TABLET | Freq: Two times a day (BID) | ORAL | 1 refills | Status: DC

## 2022-10-18 MED ORDER — MONTELUKAST SODIUM 10 MG PO TABS: 10.0000 mg | ORAL_TABLET | Freq: Every day | ORAL | 1 refills | Status: DC

## 2022-10-18 MED ORDER — RYALTRIS 665-25 MCG/ACT NA SUSP: NASAL | 5 refills | Status: DC

## 2022-10-18 MED ORDER — AIRSUPRA 90-80 MCG/ACT IN AERO: 2.0000 | INHALATION_SPRAY | RESPIRATORY_TRACT | 5 refills | Status: DC | PRN

## 2022-10-18 NOTE — Progress Notes (Signed)
FOLLOW UP  Date of Service/Encounter:  10/18/22   Assessment:   Mild persistent asthma, uncomplicated   Seasonal and perennial allergic rhinitis (rasses, ragweed, weeds, trees, indoor molds, outdoor molds, dust mites, cat, dog, cockroach, and mixed feathers, mouse, tobacco)    Plan/Recommendations:   1. Mild persistent asthma, uncomplicated - Lung testing not done today.  - Spacer use reviewed. - Daily controller medication(s): Singulair 10mg  daily - Prior to physical activity:  AirSupra 2 puffs 10-15 minutes before physical activity. - Rescue medications: AirSupra 4 puffs every 4-6 hours as needed - Asthma control goals:  * Full participation in all desired activities (may need albuterol before activity) * Albuterol use two time or less a week on average (not counting use with activity) * Cough interfering with sleep two time or less a month * Oral steroids no more than once a year * No hospitalizations  2. Seasonal and perennial allergic rhinitis - Previous testing today showed: grasses, ragweed, weeds, trees, indoor molds, outdoor molds, dust mites, cat, dog, cockroach, and mixed feathers, mouse, tobacco . - Copy of test results provided.  - Avoidance measures provided. - Start taking: Xyzal (levocetirizine) 5mg  tablet TWICE daily and and Singulair (montelukast) 10mg  daily and Ryaltris (olopatadine/mometasone) two sprays per nostril 1-2 times daily as needed - You can use an extra dose of the antihistamine, if needed, for breakthrough symptoms.  - Consider nasal saline rinses 1-2 times daily to remove allergens from the nasal cavities as well as help with mucous clearance (this is especially helpful to do before the nasal sprays are given) - We could do allergy shots in the future if needed.   3. Return in about 6 months (around 04/20/2023).    Subjective:   Sara Harding is a 52 y.o. female presenting today for follow up of  Chief Complaint  Patient presents with    Follow-up    Pt states she is doing better not coughing has much.    Donley Redder has a history of the following: Patient Active Problem List   Diagnosis Date Noted   Mild persistent asthma, uncomplicated [id number]   Seasonal and perennial allergic rhinitis 04/07/2022   FUNGAL DERMATITIS 03/13/2007   RHINITIS, CHRONIC 12/03/2006   HYPERTENSION, BENIGN ESSENTIAL 07/05/2006   OBESITY, NOS 05/15/2006    History obtained from: chart review and patient.  Sara Harding is a 52 y.o. female presenting for a follow up visit.  She was last seen in September 2023.  At that time, she continued on montelukast as well as albuterol.  For her allergic rhinitis, she continue with Xyzal as well as Ryaltris.  Since last visit, she has done well. She was coughing for one month in November. This was like the year of 2022 but only one month. She is not sure what the trigger was. It feels like something is in her throat. Then she coughs up some kind of mucous production, but it was more productive then. She WAS taking montelukast during the month of November.   Asthma/Respiratory Symptom History: She does report that the montelukast was working. She stopped it in January because she was not having any coughing at all. She still thinks that school has something to do with it and since she was on break, she tried keeping off of it. But she is back on the Singulair now in anticipation of the allergy season.  She   Allergic Rhinitis Symptom History: She mains on levocetirizine as well as Singulair and Ryaltris.  The Ryaltris is covered fairly well.  She has not tried levocetirizine twice daily.  She has not been on antibiotics since last visit.  She is overall doing very well today.  Otherwise, there have been no changes to her past medical history, surgical history, family history, or social history.    Review of Systems  Constitutional: Negative.  Negative for chills, fever, malaise/fatigue and weight loss.  HENT:   Positive for congestion. Negative for ear discharge, ear pain and sinus pain.   Eyes:  Negative for pain, discharge and redness.  Respiratory:  Negative for cough, sputum production, shortness of breath and wheezing.   Cardiovascular: Negative.  Negative for chest pain and palpitations.  Gastrointestinal:  Negative for abdominal pain, constipation, diarrhea, heartburn, nausea and vomiting.  Skin: Negative.  Negative for itching and rash.  Neurological:  Negative for dizziness and headaches.  Endo/Heme/Allergies:  Positive for environmental allergies. Does not bruise/bleed easily.       Objective:   Blood pressure 124/70, pulse 72, temperature 98.2 F (36.8 C), resp. rate 20, height 5\' 4"  (1.626 m), weight 215 lb (97.5 kg), last menstrual period 02/04/2018, SpO2 97 %. Body mass index is 36.9 kg/m.    Physical Exam Vitals reviewed.  Constitutional:      Appearance: She is well-developed.  HENT:     Head: Normocephalic and atraumatic.     Right Ear: Tympanic membrane, ear canal and external ear normal. No drainage, swelling or tenderness. Tympanic membrane is not injected, scarred, erythematous, retracted or bulging.     Left Ear: Tympanic membrane, ear canal and external ear normal. No drainage, swelling or tenderness. Tympanic membrane is not injected, scarred, erythematous, retracted or bulging.     Nose: Mucosal edema and rhinorrhea present. No nasal deformity or septal deviation.     Right Turbinates: Enlarged. Not swollen or pale.     Left Turbinates: Enlarged. Not swollen or pale.     Right Sinus: No maxillary sinus tenderness or frontal sinus tenderness.     Left Sinus: No maxillary sinus tenderness or frontal sinus tenderness.     Comments: No nasal polyps appreciated at all.     Mouth/Throat:     Mouth: Mucous membranes are not pale and not dry.     Pharynx: Uvula midline.  Eyes:     General:        Right eye: No discharge.        Left eye: No discharge.      Conjunctiva/sclera: Conjunctivae normal.     Right eye: Right conjunctiva is not injected. No chemosis.    Left eye: Left conjunctiva is not injected. No chemosis.    Pupils: Pupils are equal, round, and reactive to light.  Cardiovascular:     Rate and Rhythm: Normal rate and regular rhythm.     Heart sounds: Normal heart sounds.  Pulmonary:     Effort: Pulmonary effort is normal. No tachypnea, accessory muscle usage or respiratory distress.     Breath sounds: Normal breath sounds. No wheezing, rhonchi or rales.     Comments: Moving air well in all lung fields. No increased work of breathing.  Chest:     Chest wall: No tenderness.  Lymphadenopathy:     Head:     Right side of head: No submandibular, tonsillar or occipital adenopathy.     Left side of head: No submandibular, tonsillar or occipital adenopathy.     Cervical: No cervical adenopathy.  Skin:    Coloration: Skin  is not pale.     Findings: No abrasion, erythema, petechiae or rash. Rash is not papular, urticarial or vesicular.  Neurological:     Mental Status: She is alert.  Psychiatric:        Behavior: Behavior is cooperative.      Diagnostic studies: none      Salvatore Marvel, MD  [facility]

## 2022-10-18 NOTE — Patient Instructions (Addendum)
1. Mild persistent asthma, uncomplicated - Lung testing not done today.  - Spacer use reviewed. - Daily controller medication(s): Singulair '10mg'$  daily - Prior to physical activity:  AirSupra 2 puffs 10-15 minutes before physical activity. - Rescue medications: AirSupra 4 puffs every 4-6 hours as needed - Asthma control goals:  * Full participation in all desired activities (may need albuterol before activity) * Albuterol use two time or less a week on average (not counting use with activity) * Cough interfering with sleep two time or less a month * Oral steroids no more than once a year * No hospitalizations  2. Seasonal and perennial allergic rhinitis - Previous testing today showed: grasses, ragweed, weeds, trees, indoor molds, outdoor molds, dust mites, cat, dog, cockroach, and mixed feathers, mouse, tobacco . - Copy of test results provided.  - Avoidance measures provided. - Start taking: Xyzal (levocetirizine) '5mg'$  tablet TWICE daily and and Singulair (montelukast) '10mg'$  daily and Ryaltris (olopatadine/mometasone) two sprays per nostril 1-2 times daily as needed - You can use an extra dose of the antihistamine, if needed, for breakthrough symptoms.  - Consider nasal saline rinses 1-2 times daily to remove allergens from the nasal cavities as well as help with mucous clearance (this is especially helpful to do before the nasal sprays are given) - We could do allergy shots in the future if needed.   3. Return in about 6 months (around 04/20/2023).    Please inform us of any Emergency Department visits, hospitalizations, or changes in symptoms. Call us before going to the ED for breathing or allergy symptoms since we might be able to fit you in for a sick visit. Feel free to contact us anytime with any questions, problems, or concerns.  It was a pleasure to see you today!  Websites that have reliable patient information: 1. American Academy of Asthma, Allergy, and Immunology:  www.aaaai.org 2. Food Allergy Research and Education (FARE): foodallergy.org 3. Mothers of Asthmatics: http://www.asthmacommunitynetwork.org 4. American College of Allergy, Asthma, and Immunology: www.acaai.org   COVID-19 Vaccine Information can be found at: ShippingScam.[email or web address].uk For questions related to vaccine distribution or appointments, please email [email or web address] or call [phone].   We realize that you might be concerned about having an allergic reaction to the COVID19 vaccines. To help with that concern, WE ARE OFFERING THE COVID19 VACCINES IN OUR OFFICE! Ask the front desk for dates!     "Like" [place] on Facebook and Instagram for our latest updates!      A healthy democracy works best when [employer] participate! Make sure you are registered to vote! If you have moved or changed any of your contact information, you will need to get this updated before voting!  In some cases, you MAY be able to register to vote online: [email or web address]

## 2022-10-20 ENCOUNTER — Encounter: Payer: Self-pay | Admitting: Allergy & Immunology

## 2023-01-18 ENCOUNTER — Other Ambulatory Visit (HOSPITAL_COMMUNITY): Payer: Self-pay | Admitting: Adult Health

## 2023-01-18 DIAGNOSIS — Z1231 Encounter for screening mammogram for malignant neoplasm of breast: Secondary | ICD-10-CM

## 2023-01-25 ENCOUNTER — Ambulatory Visit (HOSPITAL_COMMUNITY)
Admission: RE | Admit: 2023-01-25 | Discharge: 2023-01-25 | Disposition: A | Discharge status: Home / Self Care | Payer: BC Managed Care – PPO | Source: Ambulatory Visit | Attending: Adult Health | Admitting: Adult Health

## 2023-01-25 ENCOUNTER — Encounter (HOSPITAL_COMMUNITY): Payer: Self-pay

## 2023-01-25 DIAGNOSIS — Z1231 Encounter for screening mammogram for malignant neoplasm of breast: Secondary | ICD-10-CM | POA: Diagnosis not present

## 2023-04-25 ENCOUNTER — Ambulatory Visit: Payer: BC Managed Care – PPO | Admitting: Allergy & Immunology

## 2023-05-07 ENCOUNTER — Encounter: Payer: Self-pay | Admitting: Family Medicine

## 2023-05-08 MED ORDER — RYALTRIS 665-25 MCG/ACT NA SUSP: NASAL | 5 refills | Status: DC

## 2023-05-27 ENCOUNTER — Other Ambulatory Visit: Payer: Self-pay | Admitting: Allergy & Immunology

## 2023-06-05 NOTE — Patient Instructions (Incomplete)
Asthma Continue montelukast 10 mg once a day to prevent cough or wheeze Continue Airsupra 2 puffs with a spacer as needed for cough or wheeze.  Do not use more than 12 puffs in a 24-hour peroid You may use albuterol 2 puffs 5 to 15 minutes before activity to decrease cough or wheeze  Allergic rhinitis Continue allergen avoidance measures directed toward pollen, mold, dust mite, pet, cockroach, feather, mouse, and tobacco as listed below Continue Xyzal 5 mg once a day as needed for runny nose or itch.  You may take an additional dose of Xyzal 5 mg once a day as needed for breakthrough symptoms. Remember to rotate to a different antihistamine about every 3 months. Some examples of over the counter antihistamines include Zyrtec (cetirizine), Xyzal (levocetirizine), Allegra (fexofenadine), and Claritin (loratidine).  Continue Ryaltris 2 sprays in each nostril twice a day for nasal symptoms Consider saline nasal rinses as needed for nasal symptoms. Use this before any medicated nasal sprays for best result Consider allergen immunotherapy if your symptoms are not well controlled with the treatment plan as listed above  Call the clinic if this treatment plan is not working well for you.  Follow up in 6 months or sooner if needed.  Reducing Pollen Exposure The American Academy of Allergy, Asthma and Immunology suggests the following steps to reduce your exposure to pollen during allergy seasons. Do not hang sheets or clothing out to dry; pollen may collect on these items. Do not mow lawns or spend time around freshly cut grass; mowing stirs up pollen. Keep windows closed at night.  Keep car windows closed while driving. Minimize morning activities outdoors, a time when pollen counts are usually at their highest. Stay indoors as much as possible when pollen counts or humidity is high and on windy days when pollen tends to remain in the air longer. Use air conditioning when possible.  Many air  conditioners have filters that trap the pollen spores. Use a HEPA room air filter to remove pollen form the indoor air you breathe. Control of Mold Allergen Mold and fungi can grow on a variety of surfaces provided certain temperature and moisture conditions exist.  Outdoor molds grow on plants, decaying vegetation and soil.  The major outdoor mold, Alternaria and Cladosporium, are found in very high numbers during hot and dry conditions.  Generally, a late Summer - Fall peak is seen for common outdoor fungal spores.  Rain will temporarily lower outdoor mold spore count, but counts rise rapidly when the rainy period ends.  The most important indoor molds are Aspergillus and Penicillium.  Dark, humid and poorly ventilated basements are ideal sites for mold growth.  The next most common sites of mold growth are the bathroom and the kitchen.  Outdoor [employer] Use air conditioning and keep windows closed Avoid exposure to decaying vegetation. Avoid leaf raking. Avoid grain handling. Consider wearing a face mask if working in moldy areas.  Indoor Mold Control Maintain humidity below 50%. Clean washable surfaces with 5% bleach solution. Remove sources e.g. Contaminated carpets.   Control of Dust Mite Allergen Dust mites play a major role in allergic asthma and rhinitis. They occur in environments with high humidity wherever human skin is found. Dust mites absorb humidity from the atmosphere (ie, they do not drink) and feed on organic matter (including shed human and animal skin). Dust mites are a microscopic type of insect that you cannot see with the naked eye. High levels of dust mites have been  detected from mattresses, pillows, carpets, upholstered furniture, bed covers, clothes, soft toys and any woven material. The principal allergen of the dust mite is found in its feces. A gram of dust may contain 1,000 mites and 250,000 fecal particles. Mite antigen is easily measured in the air during  house cleaning activities. Dust mites do not bite and do not cause harm to humans, other than by triggering allergies/asthma.  Ways to decrease your exposure to dust mites in your home:  1. Encase mattresses, box springs and pillows with a mite-impermeable barrier or cover  2. Wash sheets, blankets and drapes weekly in hot water (130 F) with detergent and dry them in a dryer on the hot setting.  3. Have the room cleaned frequently with a vacuum cleaner and a damp dust-mop. For carpeting or rugs, vacuuming with a vacuum cleaner equipped with a high-efficiency particulate air (HEPA) filter. The dust mite allergic individual should not be in a room which is being cleaned and should wait 1 hour after cleaning before going into the room.  4. Do not sleep on upholstered furniture (eg, couches).  5. If possible removing carpeting, upholstered furniture and drapery from the home is ideal. Horizontal blinds should be eliminated in the rooms where the person spends the most time (bedroom, study, television room). Washable vinyl, roller-type shades are optimal.  6. Remove all non-washable stuffed toys from the bedroom. Wash stuffed toys weekly like sheets and blankets above.  7. Reduce indoor humidity to less than 50%. Inexpensive humidity monitors can be purchased at most hardware stores. Do not use a humidifier as can make the problem worse and are not recommended.  Control of Dog or Cat Allergen Avoidance is the best way to manage a dog or cat allergy. If you have a dog or cat and are allergic to dog or cats, consider removing the dog or cat from the home. If you have a dog or cat but don't want to find it a new home, or if your family wants a pet even though someone in the household is allergic, here are some strategies that may help keep symptoms at bay:  Keep the pet out of your bedroom and restrict it to only a few rooms. Be advised that keeping the dog or cat in only one room will not limit the  allergens to that room. Don't pet, hug or kiss the dog or cat; if you do, wash your hands with soap and water. High-efficiency particulate air (HEPA) cleaners run continuously in a bedroom or living room can reduce allergen levels over time. Regular use of a high-efficiency vacuum cleaner or a central vacuum can reduce allergen levels. Giving your dog or cat a bath at least once a week can reduce airborne allergen.  Control of Cockroach Allergen Cockroach allergen has been identified as an important cause of acute attacks of asthma, especially in urban settings.  There are fifty-five species of cockroach that exist in the [place], however only three, the [place], [place] species produce allergen that can affect patients with Asthma.  Allergens can be obtained from fecal particles, egg casings and secretions from cockroaches.    Remove food sources. Reduce access to water. Seal access and entry points. Spray runways with 0.5-1% Diazinon or Chlorpyrifos Blow boric acid power under stoves and refrigerator. Place bait stations (hydramethylnon) at feeding sites.

## 2023-06-05 NOTE — Progress Notes (Signed)
   [address] [place] [address] Dept: [phone]  FOLLOW UP NOTE  Patient ID: Sara Harding, female    DOB: 04-05-1971  Age: 52 y.o. MRN: [id number] Date of Office Visit: 06/06/2023  Assessment  Chief Complaint: No chief complaint on file.  HPI Sara Harding is a 52 year old female who presents to the clinic for follow-up visit.  She was last seen in this clinic on 10/18/2022 by Dr. Dellis Anes for evaluation of asthma and allergic rhinitis.  Her last environmental allergy skin testing was on 03/08/2022 and was positive to grass pollen, weed pollen, ragweed pollen, tree pollen, indoor mold, outdoor mold, dust mite, cockroach, cat, dog, mixed feathers, mouse, and tobacco.  Discussed the use of AI scribe software for clinical note transcription with the patient, who gave verbal consent to proceed.  History of Present Illness             Drug Allergies:  Allergies  Allergen Reactions   Diphenhydramine Hcl Hives    Physical Exam: LMP 02/04/2018    Physical Exam  Diagnostics:    Assessment and Plan: No diagnosis found.  No orders of the defined types were placed in this encounter.   There are no Patient Instructions on file for this visit.  No follow-ups on file.    Thank you for the opportunity to care for this patient.  Please do not hesitate to contact me with questions.  Thermon Leyland, FNP [facility]

## 2023-06-06 ENCOUNTER — Encounter: Payer: Self-pay | Admitting: Family Medicine

## 2023-06-06 ENCOUNTER — Ambulatory Visit: Payer: BC Managed Care – PPO | Admitting: Family Medicine

## 2023-06-06 VITALS — BP 120/76 | HR 69 | Temp 98.1°F | Resp 16 | Ht 62.6 in | Wt 213.4 lb

## 2023-06-06 DIAGNOSIS — J4530 Mild persistent asthma, uncomplicated: Secondary | ICD-10-CM

## 2023-06-06 MED ORDER — MONTELUKAST SODIUM 10 MG PO TABS: 10.0000 mg | ORAL_TABLET | Freq: Every day | ORAL | 1 refills | Status: DC

## 2023-06-06 MED ORDER — SPACER/AERO-HOLDING CHAMBERS DEVI: 1.0000 | 0 refills | Status: DC | PRN

## 2023-06-14 ENCOUNTER — Encounter: Payer: Self-pay | Admitting: Family Medicine

## 2023-06-15 ENCOUNTER — Other Ambulatory Visit: Payer: Self-pay

## 2023-06-15 MED ORDER — AIRSUPRA 90-80 MCG/ACT IN AERO: 2.0000 | INHALATION_SPRAY | RESPIRATORY_TRACT | 5 refills | Status: DC | PRN

## 2023-06-15 MED ORDER — RYALTRIS 665-25 MCG/ACT NA SUSP: NASAL | 5 refills | Status: DC

## 2023-09-09 ENCOUNTER — Encounter: Payer: Self-pay | Admitting: Family Medicine

## 2023-09-12 MED ORDER — AIRSUPRA 90-80 MCG/ACT IN AERO: 2.0000 | INHALATION_SPRAY | RESPIRATORY_TRACT | 5 refills | Status: DC | PRN

## 2023-11-28 ENCOUNTER — Other Ambulatory Visit: Payer: Self-pay | Admitting: Family Medicine

## 2023-12-07 ENCOUNTER — Ambulatory Visit: Payer: BC Managed Care – PPO | Admitting: Allergy & Immunology

## 2023-12-07 VITALS — BP 122/76 | HR 71 | Temp 99.6°F | Resp 16 | Ht 64.0 in | Wt 209.4 lb

## 2023-12-07 DIAGNOSIS — J4530 Mild persistent asthma, uncomplicated: Secondary | ICD-10-CM | POA: Diagnosis not present

## 2023-12-07 DIAGNOSIS — J3089 Other allergic rhinitis: Secondary | ICD-10-CM | POA: Diagnosis not present

## 2023-12-07 DIAGNOSIS — J302 Other seasonal allergic rhinitis: Secondary | ICD-10-CM

## 2023-12-07 MED ORDER — RYALTRIS 665-25 MCG/ACT NA SUSP: NASAL | 5 refills | Status: AC

## 2023-12-07 MED ORDER — SPACER/AERO-HOLDING CHAMBERS DEVI: 1.0000 | 1 refills | Status: AC | PRN

## 2023-12-07 MED ORDER — AIRSUPRA 90-80 MCG/ACT IN AERO: 2.0000 | INHALATION_SPRAY | RESPIRATORY_TRACT | 1 refills | Status: AC | PRN

## 2023-12-07 MED ORDER — MONTELUKAST SODIUM 10 MG PO TABS: 10.0000 mg | ORAL_TABLET | Freq: Every day | ORAL | 1 refills | Status: AC

## 2023-12-07 MED ORDER — XYZAL ALLERGY 24HR 5 MG PO TABS: 5.0000 mg | ORAL_TABLET | Freq: Two times a day (BID) | ORAL | 1 refills | Status: DC

## 2023-12-07 NOTE — Progress Notes (Unsigned)
 FOLLOW UP  Date of Service/Encounter:  12/07/23   Assessment:   Mild persistent asthma, uncomplicated   Seasonal and perennial allergic rhinitis (rasses, ragweed, weeds, trees, indoor molds, outdoor molds, dust mites, cat, dog, cockroach, and mixed feathers, mouse, tobacco)  Plan/Recommendations:   There are no Patient Instructions on file for this visit.   Subjective:   Sara Harding is a 53 y.o. female presenting today for follow up of  Chief Complaint  Patient presents with   Asthma    Says she is doing well.   Allergic Rhinitis     Says she is doing well.     Sara Harding has a history of the following: Patient Active Problem List   Diagnosis Date Noted   Mild persistent asthma, uncomplicated 04/07/2022   Seasonal and perennial allergic rhinitis 04/07/2022   Dermatomycosis 03/13/2007   RHINITIS, CHRONIC 12/03/2006   HYPERTENSION, BENIGN ESSENTIAL 07/05/2006   OBESITY, NOS 05/15/2006    History obtained from: chart review and {Persons; PED relatives w/patient:19415::"patient"}.  Discussed the use of AI scribe software for clinical note transcription with the patient and/or guardian, who gave verbal consent to proceed.  Sara Harding is a 53 y.o. female presenting for {Blank single:19197::"a food challenge","a drug challenge","skin testing","a sick visit","an evaluation of ***","a follow up visit"}.  She was last seen in October 2024.  At that time, we continue with montelukast  10 mg as well as Airsupra .  For her allergic rhinitis, we continue with Xyzal  as well as an antihistamine.  Since last visit,  Discussed the use of AI scribe software for clinical note transcription with the patient, who gave verbal consent to proceed.  History of Present Illness   Sara Harding is a 53 year old female who presents for follow-up on her allergy  management.  She manages her allergies with Xyzal , Singulair , and a nasal spray. Xyzal  is taken once daily, and the nasal spray is  used as needed. She finds the nasal spray expensive but notes it lasts a while since it is not used daily. Her current medication regimen is effective in controlling her symptoms, and she does not require allergy  shots.  She has experienced spider bites at home and has tried remedies such as lavender and peppermint, which have not been effective. She also has a scar on her neck from a previous incident.  She does not have severe asthma and has not experienced significant asthma symptoms recently. A breathing test was conducted in November, and she does not feel the need for another test at this time.  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 She has a brother who is moving, and she plans to visit him. She also mentions a family history of a relative who passed away in [place].       Asthma/Respiratory Symptom History: ***  Allergic Rhinitis Symptom History: ***  Food Allergy  Symptom History: ***  Skin Symptom History: ***  GERD Symptom History: ***  Infection Symptom History: ***  Otherwise, there have been no changes to her past medical history, surgical history, family history, or social history.    Review of systems otherwise negative other than that mentioned in the HPI.    Objective:   Blood pressure 122/76, pulse 71, temperature 99.6 F (37.6 C), temperature source Temporal, resp. rate 16, height 5\' 4"  (1.626 m), weight 209 lb 6.4 oz (95 kg), last menstrual period 02/04/2018, SpO2 98%. Body mass index  is 35.94 kg/m.    Physical Exam   Diagnostic studies: {Blank single:19197::"none","deferred due to recent antihistamine use","deferred due to insurance stipulations that require a separate visit for testing","labs sent instead"," "}  Spirometry: {Blank single:19197::"results normal (FEV1: ***%, FVC: ***%, FEV1/FVC: ***%)","results abnormal (FEV1: ***%, FVC: ***%, FEV1/FVC: ***%)"}.    {Blank  single:19197::"Spirometry consistent with mild obstructive disease","Spirometry consistent with moderate obstructive disease","Spirometry consistent with severe obstructive disease","Spirometry consistent with possible restrictive disease","Spirometry consistent with mixed obstructive and restrictive disease","Spirometry uninterpretable due to technique","Spirometry consistent with normal pattern"}. {Blank single:19197::"Albuterol /Atrovent nebulizer","Xopenex/Atrovent nebulizer","Albuterol  nebulizer","Albuterol  four puffs via MDI","Xopenex four puffs via MDI"} treatment given in clinic with {Blank single:19197::"significant improvement in FEV1 per ATS criteria","significant improvement in FVC per ATS criteria","significant improvement in FEV1 and FVC per ATS criteria","improvement in FEV1, but not significant per ATS criteria","improvement in FVC, but not significant per ATS criteria","improvement in FEV1 and FVC, but not significant per ATS criteria","no improvement"}.  Allergy  Studies: {Blank single:19197::"none","deferred due to recent antihistamine use","deferred due to insurance stipulations that require a separate visit for testing","labs sent instead"," "}    {Blank single:19197::"Allergy  testing results were read and interpreted by myself, documented by clinical staff."," "}      Drexel Gentles, MD  [facility] 

## 2023-12-07 NOTE — Patient Instructions (Signed)
 1. Mild persistent asthma, uncomplicated - Lung testing not done today.  - Spacer use reviewed. - Daily controller medication(s): Singulair  10mg  daily - Prior to physical activity:  AirSupra  2 puffs 10-15 minutes before physical activity. - Rescue medications: AirSupra  4 puffs every 4-6 hours as needed - Asthma control goals:  * Full participation in all desired activities (may need albuterol  before activity) * Albuterol  use two time or less a week on average (not counting use with activity) * Cough interfering with sleep two time or less a month * Oral steroids no more than once a year * No hospitalizations  2. Seasonal and perennial allergic rhinitis - Previous testing today showed: grasses, ragweed, weeds, trees, indoor molds, outdoor molds, dust mites, cat, dog, cockroach, and mixed feathers, mouse, tobacco . - Continue taking: Xyzal  (levocetirizine) 5mg  tablet ONE TO TWO TIMES daily and and Singulair  (montelukast ) 10mg  daily and Ryaltris  (olopatadine/mometasone) two sprays per nostril 1-2 times daily as needed - You can use an extra dose of the antihistamine, if needed, for breakthrough symptoms.  - Consider nasal saline rinses 1-2 times daily to remove allergens from the nasal cavities as well as help with mucous clearance (this is especially helpful to do before the nasal sprays are given) - We could do allergy  shots in the future if needed.   3. Return in about 6 months (around 06/08/2024). You can have the follow up appointment with Dr. Idolina Maker or a Nurse Practicioner (our Nurse Practitioners are excellent and always have Physician oversight!).    Please inform us  of any Emergency Department visits, hospitalizations, or changes in symptoms. Call us  before going to the ED for breathing or allergy  symptoms since we might be able to fit you in for a sick visit. Feel free to contact us  anytime with any questions, problems, or concerns.  It was a pleasure to see you again  today!  Websites that have reliable patient information: 1. American Academy of Asthma, Allergy , and Immunology: www.aaaai.org 2. Food Allergy  Research and Education (FARE): foodallergy.org 3. Mothers of Asthmatics: http://www.asthmacommunitynetwork.org 4. American College of Allergy , Asthma, and Immunology: www.acaai.org      "Like" us  on Facebook and Instagram for our latest updates!      A healthy democracy works best when [employer] participate! Make sure you are registered to vote! If you have moved or changed any of your contact information, you will need to get this updated before voting! Scan the QR codes below to learn more!

## 2023-12-10 ENCOUNTER — Encounter: Payer: Self-pay | Admitting: Allergy & Immunology

## 2024-02-02 ENCOUNTER — Other Ambulatory Visit: Payer: Self-pay

## 2024-02-02 ENCOUNTER — Ambulatory Visit
Admission: EM | Admit: 2024-02-02 | Discharge: 2024-02-02 | Disposition: A | Discharge status: Home / Self Care | Attending: Family Medicine | Admitting: Family Medicine

## 2024-02-02 ENCOUNTER — Encounter: Payer: Self-pay | Admitting: Emergency Medicine

## 2024-02-02 DIAGNOSIS — L239 Allergic contact dermatitis, unspecified cause: Secondary | ICD-10-CM

## 2024-02-02 MED ORDER — METHYLPREDNISOLONE SODIUM SUCC 125 MG IJ SOLR
125.0000 mg | Freq: Once | INTRAMUSCULAR | Status: AC
  Administered 2024-02-02: 125 mg via INTRAMUSCULAR

## 2024-02-02 MED ORDER — MUPIROCIN 2 % EX OINT: 1.0000 | TOPICAL_OINTMENT | Freq: Two times a day (BID) | CUTANEOUS | 0 refills | Status: AC

## 2024-02-02 MED ORDER — CHLORHEXIDINE GLUCONATE 4 % EX SOLN
Freq: Every day | CUTANEOUS | 0 refills | Status: AC | PRN
Start: 2024-02-02 — End: ?

## 2024-02-02 NOTE — Discharge Instructions (Signed)
 Try not to pop the blisters on your own, keep the areas clean with Hibiclens solution and apply the mupirocin ointment and cover if they do open up to keep from getting infected.  We have given you a steroid shot today to help with the allergic skin reactions and continue your allergy  regimen

## 2024-02-02 NOTE — ED Provider Notes (Signed)
 [facility]    CSN: [id number] Arrival date & time: 02/02/24  0910      History   Chief Complaint Chief Complaint  Patient presents with   Allergic Reaction    HPI Sara Harding is a 53 y.o. female.   Patient presenting today with red swollen itchy and blistering areas to the right cheek under the eye, anterior neck and chest and right lower anterior leg that have developed over the past few days after getting what she thinks were spider bites.  Denies throat itching or swelling, chest tightness, wheezing, chest pain, shortness of breath, nausea, vomiting, fevers.  So far trying calamine and antihistamines with minimal relief.  Tends to get big skin reactions to bites.  No history of anaphylaxis.    Past Medical History:  Diagnosis Date   Chronic rhinitis    Fungal dermatitis    Hypertension    Obesity     Patient Active Problem List   Diagnosis Date Noted   Mild persistent asthma, uncomplicated 04/07/2022   Seasonal and perennial allergic rhinitis 04/07/2022   Dermatomycosis 03/13/2007   RHINITIS, CHRONIC 12/03/2006   HYPERTENSION, BENIGN ESSENTIAL 07/05/2006   OBESITY, NOS 05/15/2006    Past Surgical History:  Procedure Laterality Date   COLONOSCOPY WITH PROPOFOL  N/A 07/26/2021   Procedure: COLONOSCOPY WITH PROPOFOL ;  Surgeon: Cindie Carlin POUR, DO;  Location: [facility];  Service: Endoscopy;  Laterality: N/A;  1:45 / ASA II   MOLE REMOVAL Right 1989   Behind ear   PLANTAR'S WART EXCISION Left 1986   TONSILLECTOMY  1973    OB History     Gravida  0   Para      Term      Preterm      AB      Living         SAB      IAB      Ectopic      Multiple      Live Births               Home Medications    Prior to Admission medications   Medication Sig Start Date End Date Taking? Authorizing Provider  chlorhexidine (HIBICLENS) 4 % external liquid Apply topically daily as needed. 02/02/24  Yes Stuart Vernell Norris, PA-C   mupirocin ointment (BACTROBAN) 2 % Apply 1 Application topically 2 (two) times daily. 02/02/24  Yes Stuart Vernell Norris, PA-C  Albuterol -Budesonide (AIRSUPRA ) 90-80 MCG/ACT AERO Inhale 2 puffs into the lungs every 4 (four) hours as needed. 12/07/23   Iva Marty Saltness, MD  BIOTIN 5000 PO Take 1 tablet by mouth at bedtime.    [provider]  cholecalciferol (VITAMIN D ) 25 MCG (1000 UNIT) tablet Take 1,000 Units by mouth daily.    [provider]  lactase (LACTAID) 3000 units tablet Take 3,000 Units by mouth daily as needed (milk products).    [provider]  lisinopril (ZESTRIL) 10 MG tablet Take 10 mg by mouth daily. 05/17/21   [provider]  montelukast  (SINGULAIR ) 10 MG tablet Take 1 tablet (10 mg total) by mouth at bedtime. 12/07/23   Iva Marty Saltness, MD  Multiple Vitamin (MULTIVITAMIN) tablet Take 1 tablet by mouth daily.    [provider]  Olopatadine-Mometasone (RYALTRIS ) 665-25 MCG/ACT SUSP 2 sprays per nostril 1-2 times daily as needed. 12/07/23   Iva Marty Saltness, MD  Omega-3 1000 MG CAPS Take 1,000 mg by mouth daily.  [provider]  Spacer/Aero-Holding Chambers DEVI 1 each by Does not apply route as needed. 12/07/23   Iva Marty Saltness, MD  XYZAL  ALLERGY  24HR 5 MG tablet Take 1 tablet (5 mg total) by mouth in the morning and at bedtime. 12/07/23   Iva Marty Saltness, MD    Family History Family History  Problem Relation Age of Onset   Allergic rhinitis Mother    Diabetes Mother    Hypertension Mother    Heart disease Mother    Arthritis Mother    Hyperlipidemia Mother    Kidney disease Mother    Ulcers Mother    Sleep apnea Mother    Anemia Mother    COPD Mother    Heart disease Father    Allergic rhinitis Brother    Bone cancer Maternal Grandmother    Emphysema Maternal Grandmother    Rheumatic fever Maternal Grandfather     Social History Social History   Tobacco Use   Smoking status:  Never    Passive exposure: Past   Smokeless tobacco: Never  Vaping Use   Vaping status: Never Used  Substance Use Topics   Alcohol use: Yes    Alcohol/week: 0.0 standard drinks of alcohol    Comment: socially   Drug use: No     Allergies   Diphenhydramine hcl   Review of Systems Review of Systems Per HPI  Physical Exam Triage Vital Signs ED Triage Vitals  Encounter Vitals Group     BP 02/02/24 0916 (!) 151/84     Girls Systolic BP Percentile --      Girls Diastolic BP Percentile --      Boys Systolic BP Percentile --      Boys Diastolic BP Percentile --      Pulse Rate 02/02/24 0916 72     Resp 02/02/24 0916 20     Temp 02/02/24 0916 98.3 F (36.8 C)     Temp Source 02/02/24 0916 Oral     SpO2 02/02/24 0916 96 %     Weight --      Height --      Head Circumference --      Peak Flow --      Pain Score 02/02/24 0920 0     Pain Loc --      Pain Education --      Exclude from Growth Chart --    No data found.  Updated Vital Signs BP (!) 151/84 (BP Location: Right Arm)   Pulse 72   Temp 98.3 F (36.8 C) (Oral)   Resp 20   LMP 02/04/2018   SpO2 96%   Visual Acuity Right Eye Distance:   Left Eye Distance:   Bilateral Distance:    Right Eye Near:   Left Eye Near:    Bilateral Near:     Physical Exam Vitals and nursing note reviewed.  Constitutional:      Appearance: Normal appearance. She is not ill-appearing.  HENT:     Head: Atraumatic.   Eyes:     Extraocular Movements: Extraocular movements intact.     Conjunctiva/sclera: Conjunctivae normal.    Cardiovascular:     Rate and Rhythm: Normal rate and regular rhythm.     Heart sounds: Normal heart sounds.  Pulmonary:     Effort: Pulmonary effort is normal.     Breath sounds: Normal breath sounds.   Musculoskeletal:        General: Normal range of motion.  Cervical back: Normal range of motion and neck supple.   Skin:    General: Skin is warm and dry.     Findings: Rash present.      Comments: Erythematous macules and blisters to the right anterior lower leg, chest wall, neck anteriorly, right cheek region   Neurological:     Mental Status: She is alert and oriented to person, place, and time.   Psychiatric:        Mood and Affect: Mood normal.        Thought Content: Thought content normal.        Judgment: Judgment normal.      UC Treatments / Results  Labs (all labs ordered are listed, but only abnormal results are displayed) Labs Reviewed - No data to display  EKG   Radiology No results found.  Procedures Procedures (including critical care time)  Medications Ordered in UC Medications  methylPREDNISolone sodium succinate (SOLU-MEDROL) 125 mg/2 mL injection 125 mg (125 mg Intramuscular Given 02/02/24 0953)    Initial Impression / Assessment and Plan / UC Course  I have reviewed the triage vital signs and the nursing notes.  Pertinent labs & imaging results that were available during my care of the patient were reviewed by me and considered in my medical decision making (see chart for details).     Consistent with localized skin reactions to spider bites, treat with IM Solu-Medrol, Hibiclens and Bactroban as well as good home wound care for the blistering areas to keep from a secondary infection, antihistamines.  Return for worsening symptoms.  Final Clinical Impressions(s) / UC Diagnoses   Final diagnoses:  Allergic dermatitis     Discharge Instructions      Try not to pop the blisters on your own, keep the areas clean with Hibiclens solution and apply the mupirocin ointment and cover if they do open up to keep from getting infected.  We have given you a steroid shot today to help with the allergic skin reactions and continue your allergy  regimen    ED Prescriptions     Medication Sig Dispense Auth. Provider   chlorhexidine (HIBICLENS) 4 % external liquid Apply topically daily as needed. 236 mL Stuart Vernell Norris, PA-C   mupirocin  ointment (BACTROBAN) 2 % Apply 1 Application topically 2 (two) times daily. 60 g Stuart Vernell Norris, [place]      PDMP not reviewed this encounter.   Stuart Vernell Norris, [place] 02/02/24 1025

## 2024-02-02 NOTE — ED Triage Notes (Signed)
 Pt reports possible spider bites to right lower leg and below right eye since last Tuesday. Reports site bubble then swelling starts. Pt reports swelling below right eye has increased .  Has tried clear calamine cream for sites, usually takes daily allergy  med as well.

## 2024-02-11 ENCOUNTER — Other Ambulatory Visit (HOSPITAL_COMMUNITY): Payer: Self-pay | Admitting: Adult Health

## 2024-02-11 DIAGNOSIS — Z1231 Encounter for screening mammogram for malignant neoplasm of breast: Secondary | ICD-10-CM

## 2024-02-18 ENCOUNTER — Ambulatory Visit (HOSPITAL_COMMUNITY)
Admission: RE | Admit: 2024-02-18 | Discharge: 2024-02-18 | Disposition: A | Discharge status: Home / Self Care | Source: Ambulatory Visit | Attending: Adult Health | Admitting: Adult Health

## 2024-02-18 DIAGNOSIS — Z1231 Encounter for screening mammogram for malignant neoplasm of breast: Secondary | ICD-10-CM | POA: Diagnosis present

## 2024-06-20 ENCOUNTER — Ambulatory Visit: Admitting: Allergy & Immunology

## 2024-06-20 ENCOUNTER — Encounter: Payer: Self-pay | Admitting: Allergy & Immunology

## 2024-06-20 VITALS — BP 106/70 | HR 67 | Temp 98.7°F | Wt 208.2 lb

## 2024-06-20 DIAGNOSIS — J4530 Mild persistent asthma, uncomplicated: Secondary | ICD-10-CM

## 2024-06-20 DIAGNOSIS — J302 Other seasonal allergic rhinitis: Secondary | ICD-10-CM | POA: Diagnosis not present

## 2024-06-20 DIAGNOSIS — J3089 Other allergic rhinitis: Secondary | ICD-10-CM

## 2024-06-20 NOTE — Progress Notes (Signed)
 FOLLOW UP  Date of Service/Encounter:  06/20/24   Assessment:   Mild persistent asthma, uncomplicated   Seasonal and perennial allergic rhinitis (rasses, ragweed, weeds, trees, indoor molds, outdoor molds, dust mites, cat, dog, cockroach, and mixed feathers, mouse, tobacco)  Plan/Recommendations:   1. Mild persistent asthma, uncomplicated - Lung testing looked amazing today (STRONG WORK)! - Spacer use reviewed. - Daily controller medication(s): Singulair  10mg  daily - Prior to physical activity:  AirSupra  2 puffs 10-15 minutes before physical activity. - Rescue medications: AirSupra  4 puffs every 4-6 hours as needed - Asthma control goals:  * Full participation in all desired activities (may need albuterol  before activity) * Albuterol  use two time or less a week on average (not counting use with activity) * Cough interfering with sleep two time or less a month * Oral steroids no more than once a year * No hospitalizations  2. Seasonal and perennial allergic rhinitis - Previous testing today showed: grasses, ragweed, weeds, trees, indoor molds, outdoor molds, dust mites, cat, dog, cockroach, and mixed feathers, mouse, tobacco - Continue taking: Xyzal  (levocetirizine) 5mg  tablet ONE TO TWO TIMES daily and and Singulair  (montelukast ) 10mg  daily and Ryaltris  (olopatadine/mometasone) two sprays per nostril 1-2 times daily as needed - You can use an extra dose of the antihistamine, if needed, for breakthrough symptoms.  - Consider nasal saline rinses 1-2 times daily to remove allergens from the nasal cavities as well as help with mucous clearance (this is especially helpful to do before the nasal sprays are given) - We could do allergy  shots in the future if needed.   3. Return in about 1 year (around 06/20/2025). You can have the follow up appointment with Dr. Iva or a Nurse Practicioner (our Nurse Practitioners are excellent and always have Physician oversight!).     Subjective:   Sara Harding is a 53 y.o. female presenting today for follow up of  Chief Complaint  Patient presents with   Follow-up    Pt says asthma symptoms have improved but she still has flare ups time to time.     Sara Harding has a history of the following: Patient Active Problem List   Diagnosis Date Noted   Mild persistent asthma, uncomplicated 04/07/2022   Seasonal and perennial allergic rhinitis 04/07/2022   Dermatomycosis 03/13/2007   RHINITIS, CHRONIC 12/03/2006   HYPERTENSION, BENIGN ESSENTIAL 07/05/2006   OBESITY, NOS 05/15/2006    History obtained from: chart review and patient.  Discussed the use of AI scribe software for clinical note transcription with the patient and/or guardian, who gave verbal consent to proceed.   Since last visit, she has done well.   Sara Harding is a 53 y.o. female presenting for a follow up visit. She was last seen in May 2025.  At that time, Lyme testing was not done.  We continue with Singulair  10 mg daily as well as AirSupra  as needed.  For her rhinitis, we continue with Xyzal  as well as Singulair  and Ryaltris .  Since the last visit, she has done relatively well.   Asthma/Respiratory Symptom History: Her breathing has improved significantly, with a reduced need for ipratropium, which she previously relied on heavily. She is uncertain about the cause of this improvement. She does not refill her AirSupra  often, indicating infrequent use. She rarely coughs at night, and when she does, it is not severe enough to disrupt her sleep, marking a significant improvement from when she used to cough frequently at night.  Allergic Rhinitis Symptom History:  Her allergies are less severe than before, though she still experiences mild symptoms during the fall, particularly when leaves first fall. She manages these episodes by increasing fluid intake and occasionally coughing. She takes Xyzal  regularly during the fall to prevent sinus headaches, which  she used to experience frequently. She believes the medication is effective as she no longer gets these headaches.  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 She is considering working at a bookstore in [place] to be closer to her mother, who lives in [place]. Her father's recent passing due to dementia and oxygen issues has impacted her family dynamics.   Otherwise, there have been no changes to her past medical history, surgical history, family history, or social history.    Review of systems otherwise negative other than that mentioned in the HPI.    Objective:   Blood pressure 106/70, pulse 67, temperature 98.7 F (37.1 C), temperature source Temporal, weight 208 lb 3.2 oz (94.4 kg), last menstrual period 02/04/2018, SpO2 99%. Body mass index is 35.74 kg/m.    Physical Exam Vitals reviewed.  Constitutional:      Appearance: She is well-developed.  HENT:     Head: Normocephalic and atraumatic.     Right Ear: Tympanic membrane, ear canal and external ear normal. No drainage, swelling or tenderness. Tympanic membrane is not injected, scarred, erythematous, retracted or bulging.     Left Ear: Tympanic membrane, ear canal and external ear normal. No drainage, swelling or tenderness. Tympanic membrane is not injected, scarred, erythematous, retracted or bulging.     Nose: Mucosal edema and rhinorrhea present. No nasal deformity or septal deviation.     Right Turbinates: Enlarged, swollen and pale.     Left Turbinates: Enlarged, swollen and pale.     Right Sinus: No maxillary sinus tenderness or frontal sinus tenderness.     Left Sinus: No maxillary sinus tenderness or frontal sinus tenderness.     Comments: No nasal polyps appreciated at all.     Mouth/Throat:     Mouth: Mucous membranes are not pale and not dry.     Pharynx: Uvula midline.  Eyes:     General:        Right eye: No discharge.        Left eye: No discharge.      Conjunctiva/sclera: Conjunctivae normal.     Right eye: Right conjunctiva is not injected. No chemosis.    Left eye: Left conjunctiva is not injected. No chemosis.    Pupils: Pupils are equal, round, and reactive to light.  Cardiovascular:     Rate and Rhythm: Normal rate and regular rhythm.     Heart sounds: Normal heart sounds.  Pulmonary:     Effort: Pulmonary effort is normal. No tachypnea, accessory muscle usage or respiratory distress.     Breath sounds: Normal breath sounds. No wheezing, rhonchi or rales.     Comments: Moving air well in all lung fields. No increased work of breathing.  Chest:     Chest wall: No tenderness.  Lymphadenopathy:     Head:     Right side of head: No submandibular, tonsillar or occipital adenopathy.     Left side of head: No submandibular, tonsillar or occipital adenopathy.     Cervical: No cervical adenopathy.  Skin:    Coloration: Skin is not pale.     Findings: No abrasion, erythema, petechiae or rash. Rash is not papular, urticarial or vesicular.  Neurological:     Mental Status: She is alert.  Psychiatric:        Behavior: Behavior is cooperative.      Diagnostic studies:    Spirometry: results normal (FEV1: 2.15/87%, FVC: 2.51/81%, FEV1/FVC: 86%).    Spirometry consistent with normal pattern.   Allergy  Studies: none        Marty Shaggy, MD  [facility] 

## 2024-06-20 NOTE — Patient Instructions (Addendum)
 1. Mild persistent asthma, uncomplicated - Lung testing looked amazing today (STRONG WORK)! - Spacer use reviewed. - Daily controller medication(s): Singulair  10mg  daily - Prior to physical activity:  AirSupra  2 puffs 10-15 minutes before physical activity. - Rescue medications: AirSupra  4 puffs every 4-6 hours as needed - Asthma control goals:  * Full participation in all desired activities (may need albuterol  before activity) * Albuterol  use two time or less a week on average (not counting use with activity) * Cough interfering with sleep two time or less a month * Oral steroids no more than once a year * No hospitalizations  2. Seasonal and perennial allergic rhinitis - Previous testing today showed: grasses, ragweed, weeds, trees, indoor molds, outdoor molds, dust mites, cat, dog, cockroach, and mixed feathers, mouse, tobacco - Continue taking: Xyzal  (levocetirizine) 5mg  tablet ONE TO TWO TIMES daily and and Singulair  (montelukast ) 10mg  daily and Ryaltris  (olopatadine/mometasone) two sprays per nostril 1-2 times daily as needed - You can use an extra dose of the antihistamine, if needed, for breakthrough symptoms.  - Consider nasal saline rinses 1-2 times daily to remove allergens from the nasal cavities as well as help with mucous clearance (this is especially helpful to do before the nasal sprays are given) - We could do allergy  shots in the future if needed.   3. Return in about 1 year (around 06/20/2025). You can have the follow up appointment with Dr. Iva or a Nurse Practicioner (our Nurse Practitioners are excellent and always have Physician oversight!).    Please inform us  of any Emergency Department visits, hospitalizations, or changes in symptoms. Call us  before going to the ED for breathing or allergy  symptoms since we might be able to fit you in for a sick visit. Feel free to contact us  anytime with any questions, problems, or concerns.  It was a pleasure to see you  again today!  Websites that have reliable patient information: 1. American Academy of Asthma, Allergy , and Immunology: www.aaaai.org 2. Food Allergy  Research and Education (FARE): foodallergy.org 3. Mothers of Asthmatics: http://www.asthmacommunitynetwork.org 4. [employer] of Allergy , Asthma, and Immunology: www.acaai.org      "Like" us  on Facebook and Instagram for our latest updates!      A healthy democracy works best when [employer] participate! Make sure you are registered to vote! If you have moved or changed any of your contact information, you will need to get this updated before voting! Scan the QR codes below to learn more!

## 2024-08-12 ENCOUNTER — Encounter: Admitting: Obstetrics & Gynecology

## 2024-08-13 ENCOUNTER — Other Ambulatory Visit: Payer: Self-pay

## 2024-08-13 MED ORDER — XYZAL ALLERGY 24HR 5 MG PO TABS: 5.0000 mg | ORAL_TABLET | Freq: Two times a day (BID) | ORAL | 1 refills | Status: AC

## 2024-08-13 NOTE — Progress Notes (Signed)
 Fax came over for change in pharmacy. [facility]. Sent.

## 2025-06-24 ENCOUNTER — Ambulatory Visit: Admitting: Allergy & Immunology
